# Patient Record
Sex: Female | Born: 1940 | Race: White | Hispanic: No | Smoking: Former smoker
Health system: Southern US, Community
[De-identification: ages and names within clinical notes are randomized; demographics above are authoritative.]

## PROBLEM LIST (undated history)

## (undated) ENCOUNTER — Encounter

## (undated) ENCOUNTER — Ambulatory Visit: Payer: MEDICARE

## (undated) ENCOUNTER — Ambulatory Visit

## (undated) ENCOUNTER — Telehealth

## (undated) DIAGNOSIS — E785 Hyperlipidemia, unspecified: Secondary | ICD-10-CM

## (undated) DIAGNOSIS — I639 Cerebral infarction, unspecified: Secondary | ICD-10-CM

## (undated) DIAGNOSIS — J449 Chronic obstructive pulmonary disease, unspecified: Secondary | ICD-10-CM

## (undated) DIAGNOSIS — C946 Myelodysplastic disease, not classified: Secondary | ICD-10-CM

## (undated) DIAGNOSIS — I10 Essential (primary) hypertension: Secondary | ICD-10-CM

## (undated) HISTORY — PX: TUBAL LIGATION: SHX77

## (undated) HISTORY — PX: SHOULDER SURGERY: SHX246

## (undated) HISTORY — DX: Cerebral infarction, unspecified: I63.9

---
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
## 2013-06-17 ENCOUNTER — Ambulatory Visit: Payer: Self-pay | Admitting: Family Medicine

## 2013-08-07 ENCOUNTER — Ambulatory Visit: Payer: Self-pay | Admitting: Internal Medicine

## 2013-08-07 LAB — CBC CANCER CENTER
Comment - H1-Com5: NORMAL
Eosinophil: 2 %
HCT: 28.9 % — ABNORMAL LOW (ref 35.0–47.0)
HGB: 9.7 g/dL — ABNORMAL LOW (ref 12.0–16.0)
Lymphocytes: 18 %
MCH: 28.8 pg (ref 26.0–34.0)
MCHC: 33.3 g/dL (ref 32.0–36.0)
MCV: 86 fL (ref 80–100)
MONOS PCT: 2 %
Platelet: 191 x10 3/mm (ref 150–440)
RBC: 3.36 10*6/uL — ABNORMAL LOW (ref 3.80–5.20)
RDW: 19.5 % — AB (ref 11.5–14.5)
Segmented Neutrophils: 78 %
WBC: 5.5 x10 3/mm (ref 3.6–11.0)

## 2013-08-07 LAB — IRON AND TIBC
IRON: 76 ug/dL (ref 50–170)
Iron Bind.Cap.(Total): 303 ug/dL (ref 250–450)
Iron Saturation: 25 %
Unbound Iron-Bind.Cap.: 227 ug/dL

## 2013-08-07 LAB — FOLATE: Folic Acid: 29 ng/mL (ref 3.1–100.0)

## 2013-08-07 LAB — RETICULOCYTES
ABSOLUTE RETIC COUNT: 0.1412 10*6/uL (ref 0.019–0.186)
Reticulocyte: 4.05 % — ABNORMAL HIGH (ref 0.4–3.1)

## 2013-08-07 LAB — SEDIMENTATION RATE: Erythrocyte Sed Rate: 16 mm/hr (ref 0–30)

## 2013-08-07 LAB — LACTATE DEHYDROGENASE: LDH: 213 U/L (ref 81–246)

## 2013-08-07 LAB — FERRITIN: FERRITIN (ARMC): 76 ng/mL (ref 8–388)

## 2013-08-08 LAB — URINE IEP, RANDOM

## 2013-08-11 LAB — OCCULT BLOOD X 1 CARD TO LAB, STOOL
Occult Blood, Feces: NEGATIVE
Occult Blood, Feces: NEGATIVE

## 2013-08-11 LAB — PROT IMMUNOELECTROPHORES(ARMC)

## 2013-08-19 LAB — CBC CANCER CENTER
Basophil: 2 %
EOS PCT: 4 %
HCT: 29.4 % — ABNORMAL LOW (ref 35.0–47.0)
HGB: 9.9 g/dL — ABNORMAL LOW (ref 12.0–16.0)
Lymphocytes: 14 %
MCH: 29.3 pg (ref 26.0–34.0)
MCHC: 33.7 g/dL (ref 32.0–36.0)
MCV: 87 fL (ref 80–100)
Monocytes: 11 %
PLATELETS: 204 x10 3/mm (ref 150–440)
RBC: 3.39 10*6/uL — ABNORMAL LOW (ref 3.80–5.20)
RDW: 19.3 % — AB (ref 11.5–14.5)
Segmented Neutrophils: 69 %
WBC: 6.6 x10 3/mm (ref 3.6–11.0)

## 2013-08-25 ENCOUNTER — Ambulatory Visit: Payer: Self-pay | Admitting: Internal Medicine

## 2013-09-24 ENCOUNTER — Ambulatory Visit: Payer: Self-pay | Admitting: Internal Medicine

## 2013-11-06 ENCOUNTER — Ambulatory Visit: Payer: Self-pay | Admitting: Internal Medicine

## 2013-11-06 LAB — CBC CANCER CENTER
BASOS ABS: 0.1 x10 3/mm (ref 0.0–0.1)
BASOS PCT: 0.8 %
EOS ABS: 0.2 x10 3/mm (ref 0.0–0.7)
Eosinophil %: 3.3 %
HCT: 31.7 % — ABNORMAL LOW (ref 35.0–47.0)
HGB: 10.7 g/dL — AB (ref 12.0–16.0)
LYMPHS ABS: 1.1 x10 3/mm (ref 1.0–3.6)
Lymphocyte %: 18 %
MCH: 30 pg (ref 26.0–34.0)
MCHC: 33.9 g/dL (ref 32.0–36.0)
MCV: 89 fL (ref 80–100)
Monocyte #: 0.4 x10 3/mm (ref 0.2–0.9)
Monocyte %: 6.9 %
Neutrophil #: 4.5 x10 3/mm (ref 1.4–6.5)
Neutrophil %: 71 %
PLATELETS: 184 x10 3/mm (ref 150–440)
RBC: 3.57 10*6/uL — ABNORMAL LOW (ref 3.80–5.20)
RDW: 16.5 % — ABNORMAL HIGH (ref 11.5–14.5)
WBC: 6.4 x10 3/mm (ref 3.6–11.0)

## 2013-11-25 ENCOUNTER — Ambulatory Visit: Payer: Self-pay | Admitting: Internal Medicine

## 2013-12-25 ENCOUNTER — Ambulatory Visit: Payer: Self-pay | Admitting: Internal Medicine

## 2013-12-25 LAB — CBC CANCER CENTER
Basophil #: 0 x10 3/mm (ref 0.0–0.1)
Basophil %: 0.6 %
Eosinophil #: 0.1 x10 3/mm (ref 0.0–0.7)
Eosinophil %: 2.6 %
HCT: 30.2 % — AB (ref 35.0–47.0)
HGB: 10.3 g/dL — AB (ref 12.0–16.0)
Lymphocyte #: 0.7 x10 3/mm — ABNORMAL LOW (ref 1.0–3.6)
Lymphocyte %: 13.5 %
MCH: 30.4 pg (ref 26.0–34.0)
MCHC: 34 g/dL (ref 32.0–36.0)
MCV: 89 fL (ref 80–100)
Monocyte #: 0.5 x10 3/mm (ref 0.2–0.9)
Monocyte %: 9 %
Neutrophil #: 3.8 x10 3/mm (ref 1.4–6.5)
Neutrophil %: 74.3 %
PLATELETS: 161 x10 3/mm (ref 150–440)
RBC: 3.38 10*6/uL — ABNORMAL LOW (ref 3.80–5.20)
RDW: 15.7 % — AB (ref 11.5–14.5)
WBC: 5.1 x10 3/mm (ref 3.6–11.0)

## 2014-01-25 ENCOUNTER — Ambulatory Visit: Payer: Self-pay | Admitting: Internal Medicine

## 2014-02-06 ENCOUNTER — Ambulatory Visit: Payer: Self-pay | Admitting: Nurse Practitioner

## 2014-02-10 ENCOUNTER — Ambulatory Visit: Payer: Self-pay | Admitting: Internal Medicine

## 2014-02-24 ENCOUNTER — Ambulatory Visit: Payer: Self-pay | Admitting: Internal Medicine

## 2014-02-26 LAB — CBC CANCER CENTER
Basophil #: 0 x10 3/mm (ref 0.0–0.1)
Basophil %: 0.7 %
EOS ABS: 0.2 x10 3/mm (ref 0.0–0.7)
EOS PCT: 2.4 %
HCT: 31.5 % — ABNORMAL LOW (ref 35.0–47.0)
HGB: 10.5 g/dL — ABNORMAL LOW (ref 12.0–16.0)
LYMPHS ABS: 0.9 x10 3/mm — AB (ref 1.0–3.6)
Lymphocyte %: 13.1 %
MCH: 29.5 pg (ref 26.0–34.0)
MCHC: 33.3 g/dL (ref 32.0–36.0)
MCV: 88 fL (ref 80–100)
MONO ABS: 0.4 x10 3/mm (ref 0.2–0.9)
MONOS PCT: 6.1 %
NEUTROS PCT: 77.7 %
Neutrophil #: 5.1 x10 3/mm (ref 1.4–6.5)
Platelet: 180 x10 3/mm (ref 150–440)
RBC: 3.56 10*6/uL — AB (ref 3.80–5.20)
RDW: 16.9 % — ABNORMAL HIGH (ref 11.5–14.5)
WBC: 6.6 x10 3/mm (ref 3.6–11.0)

## 2014-02-26 LAB — CREATININE, SERUM
Creatinine: 0.62 mg/dL (ref 0.60–1.30)
EGFR (African American): >60
EGFR (Non-African Amer.): >60

## 2014-02-26 LAB — LACTATE DEHYDROGENASE: LDH: 201 U/L (ref 81–246)

## 2014-03-27 ENCOUNTER — Ambulatory Visit: Payer: Self-pay | Admitting: Internal Medicine

## 2014-07-02 ENCOUNTER — Ambulatory Visit
Admit: 2014-07-02 | Disposition: A | Discharge status: Home / Self Care | Payer: Self-pay | Attending: Internal Medicine | Admitting: Internal Medicine

## 2014-07-02 LAB — CBC CANCER CENTER
BASOS PCT: 0.9 %
Basophil #: 0 x10 3/mm (ref 0.0–0.1)
Eosinophil #: 0.2 x10 3/mm (ref 0.0–0.7)
Eosinophil %: 3.8 %
HCT: 30.1 % — AB (ref 35.0–47.0)
HGB: 10.2 g/dL — AB (ref 12.0–16.0)
LYMPHS PCT: 15.7 %
Lymphocyte #: 0.9 x10 3/mm — ABNORMAL LOW (ref 1.0–3.6)
MCH: 29.1 pg (ref 26.0–34.0)
MCHC: 34 g/dL (ref 32.0–36.0)
MCV: 86 fL (ref 80–100)
MONOS PCT: 7 %
Monocyte #: 0.4 x10 3/mm (ref 0.2–0.9)
NEUTROS ABS: 4.2 x10 3/mm (ref 1.4–6.5)
Neutrophil %: 72.6 %
Platelet: 187 x10 3/mm (ref 150–440)
RBC: 3.51 10*6/uL — ABNORMAL LOW (ref 3.80–5.20)
RDW: 16.5 % — AB (ref 11.5–14.5)
WBC: 5.7 x10 3/mm (ref 3.6–11.0)

## 2014-07-06 LAB — PROT IMMUNOELECTROPHORES(ARMC)

## 2014-10-28 ENCOUNTER — Other Ambulatory Visit: Payer: Self-pay | Admitting: *Deleted

## 2014-10-28 DIAGNOSIS — D472 Monoclonal gammopathy: Secondary | ICD-10-CM

## 2014-10-29 ENCOUNTER — Inpatient Hospital Stay: Payer: Medicare Other | Attending: Internal Medicine

## 2014-10-29 DIAGNOSIS — R161 Splenomegaly, not elsewhere classified: Secondary | ICD-10-CM | POA: Diagnosis not present

## 2014-10-29 DIAGNOSIS — D649 Anemia, unspecified: Secondary | ICD-10-CM | POA: Insufficient documentation

## 2014-10-29 DIAGNOSIS — D472 Monoclonal gammopathy: Secondary | ICD-10-CM | POA: Insufficient documentation

## 2014-10-29 LAB — CBC WITH DIFFERENTIAL/PLATELET
BASOS PCT: 1 %
Basophils Absolute: 0 10*3/uL (ref 0–0.1)
Eosinophils Absolute: 0.2 10*3/uL (ref 0–0.7)
Eosinophils Relative: 3 %
HEMATOCRIT: 28.8 % — AB (ref 35.0–47.0)
HEMOGLOBIN: 10 g/dL — AB (ref 12.0–16.0)
LYMPHS ABS: 0.8 10*3/uL — AB (ref 1.0–3.6)
Lymphocytes Relative: 17 %
MCH: 30.6 pg (ref 26.0–34.0)
MCHC: 34.8 g/dL (ref 32.0–36.0)
MCV: 87.9 fL (ref 80.0–100.0)
MONO ABS: 0.3 10*3/uL (ref 0.2–0.9)
MONOS PCT: 7 %
NEUTROS PCT: 72 %
Neutro Abs: 3.5 10*3/uL (ref 1.4–6.5)
PLATELETS: 164 10*3/uL (ref 150–440)
RBC: 3.27 MIL/uL — AB (ref 3.80–5.20)
RDW: 15.3 % — AB (ref 11.5–14.5)
WBC: 4.8 10*3/uL (ref 3.6–11.0)

## 2014-10-29 LAB — CREATININE, SERUM
CREATININE: 0.56 mg/dL (ref 0.44–1.00)
GFR calc Af Amer: >60 mL/min (ref >60)
GFR calc non Af Amer: >60 mL/min (ref >60)

## 2014-10-29 LAB — LACTATE DEHYDROGENASE: LDH: 157 U/L (ref 98–192)

## 2014-10-29 LAB — CALCIUM: Calcium: 9.4 mg/dL (ref 8.9–10.3)

## 2014-10-30 LAB — PROTEIN ELECTROPHORESIS, SERUM
A/G Ratio: 1.5 (ref 0.7–1.7)
ALPHA-1-GLOBULIN: 0.3 g/dL (ref 0.0–0.4)
ALPHA-2-GLOBULIN: 0.5 g/dL (ref 0.4–1.0)
Albumin ELP: 3.9 g/dL (ref 2.9–4.4)
Beta Globulin: 0.8 g/dL (ref 0.7–1.3)
Gamma Globulin: 1 g/dL (ref 0.4–1.8)
Globulin, Total: 2.6 g/dL (ref 2.2–3.9)
Total Protein ELP: 6.5 g/dL (ref 6.0–8.5)

## 2014-11-09 ENCOUNTER — Other Ambulatory Visit: Payer: Self-pay | Admitting: Internal Medicine

## 2014-11-09 DIAGNOSIS — R270 Ataxia, unspecified: Secondary | ICD-10-CM

## 2014-11-09 DIAGNOSIS — R29898 Other symptoms and signs involving the musculoskeletal system: Secondary | ICD-10-CM

## 2014-11-10 ENCOUNTER — Other Ambulatory Visit: Payer: Self-pay

## 2014-11-10 ENCOUNTER — Emergency Department: Payer: Medicare Other

## 2014-11-10 ENCOUNTER — Emergency Department
Admission: EM | Admit: 2014-11-10 | Discharge: 2014-11-10 | Disposition: A | Discharge status: Other Acute Inpatient Hospital | Payer: Medicare Other | Attending: Emergency Medicine | Admitting: Emergency Medicine

## 2014-11-10 ENCOUNTER — Encounter: Payer: Self-pay | Admitting: Emergency Medicine

## 2014-11-10 DIAGNOSIS — W1839XA Other fall on same level, initial encounter: Secondary | ICD-10-CM | POA: Diagnosis not present

## 2014-11-10 DIAGNOSIS — D432 Neoplasm of uncertain behavior of brain, unspecified: Secondary | ICD-10-CM | POA: Diagnosis not present

## 2014-11-10 DIAGNOSIS — S99921A Unspecified injury of right foot, initial encounter: Secondary | ICD-10-CM | POA: Insufficient documentation

## 2014-11-10 DIAGNOSIS — Y9389 Activity, other specified: Secondary | ICD-10-CM | POA: Diagnosis not present

## 2014-11-10 DIAGNOSIS — G936 Cerebral edema: Secondary | ICD-10-CM

## 2014-11-10 DIAGNOSIS — Y998 Other external cause status: Secondary | ICD-10-CM | POA: Diagnosis not present

## 2014-11-10 DIAGNOSIS — D496 Neoplasm of unspecified behavior of brain: Secondary | ICD-10-CM

## 2014-11-10 DIAGNOSIS — I10 Essential (primary) hypertension: Secondary | ICD-10-CM | POA: Insufficient documentation

## 2014-11-10 DIAGNOSIS — Z720 Tobacco use: Secondary | ICD-10-CM | POA: Diagnosis not present

## 2014-11-10 DIAGNOSIS — R531 Weakness: Secondary | ICD-10-CM | POA: Diagnosis present

## 2014-11-10 DIAGNOSIS — Y9289 Other specified places as the place of occurrence of the external cause: Secondary | ICD-10-CM | POA: Insufficient documentation

## 2014-11-10 HISTORY — DX: Hyperlipidemia, unspecified: E78.5

## 2014-11-10 HISTORY — DX: Myelodysplastic disease, not elsewhere classified: C94.6

## 2014-11-10 HISTORY — DX: Essential (primary) hypertension: I10

## 2014-11-10 LAB — CBC
HEMATOCRIT: 30.8 % — AB (ref 35.0–47.0)
HEMOGLOBIN: 10.6 g/dL — AB (ref 12.0–16.0)
MCH: 31 pg (ref 26.0–34.0)
MCHC: 34.5 g/dL (ref 32.0–36.0)
MCV: 89.9 fL (ref 80.0–100.0)
Platelets: 171 10*3/uL (ref 150–440)
RBC: 3.43 MIL/uL — AB (ref 3.80–5.20)
RDW: 15.5 % — ABNORMAL HIGH (ref 11.5–14.5)
WBC: 6 10*3/uL (ref 3.6–11.0)

## 2014-11-10 LAB — DIFFERENTIAL
BASOS ABS: 0 10*3/uL (ref 0–0.1)
Basophils Relative: 1 %
EOS ABS: 0.1 10*3/uL (ref 0–0.7)
Eosinophils Relative: 2 %
LYMPHS ABS: 0.7 10*3/uL — AB (ref 1.0–3.6)
LYMPHS PCT: 12 %
MONOS PCT: 5 %
Monocytes Absolute: 0.3 10*3/uL (ref 0.2–0.9)
NEUTROS PCT: 80 %
Neutro Abs: 4.8 10*3/uL (ref 1.4–6.5)

## 2014-11-10 LAB — COMPREHENSIVE METABOLIC PANEL
ALBUMIN: 4.6 g/dL (ref 3.5–5.0)
ALK PHOS: 34 U/L — AB (ref 38–126)
ALT: 20 U/L (ref 14–54)
AST: 29 U/L (ref 15–41)
Anion gap: 8 (ref 5–15)
BILIRUBIN TOTAL: 2.6 mg/dL — AB (ref 0.3–1.2)
BUN: 8 mg/dL (ref 6–20)
CALCIUM: 9.2 mg/dL (ref 8.9–10.3)
CO2: 27 mmol/L (ref 22–32)
CREATININE: 0.5 mg/dL (ref 0.44–1.00)
Chloride: 106 mmol/L (ref 101–111)
GFR calc Af Amer: >60 mL/min (ref >60)
GLUCOSE: 115 mg/dL — AB (ref 65–99)
POTASSIUM: 3.3 mmol/L — AB (ref 3.5–5.1)
Sodium: 141 mmol/L (ref 135–145)
TOTAL PROTEIN: 7.1 g/dL (ref 6.5–8.1)

## 2014-11-10 LAB — PROTIME-INR
INR: 1.09
Prothrombin Time: 14.3 seconds (ref 11.4–15.0)

## 2014-11-10 LAB — APTT: APTT: 31 s (ref 24–36)

## 2014-11-10 LAB — TROPONIN I

## 2014-11-10 MED ORDER — DEXAMETHASONE SODIUM PHOSPHATE 10 MG/ML IJ SOLN
10.0000 mg | Freq: Once | INTRAMUSCULAR | Status: AC
  Administered 2014-11-10: 10 mg via INTRAVENOUS
  Filled 2014-11-10: qty 1

## 2014-11-10 NOTE — ED Notes (Signed)
Awaiting ems to arrive for transportation of pt to [facility].

## 2014-11-10 NOTE — ED Provider Notes (Signed)
[facility] Emergency Department Provider Note  ____________________________________________  Time seen: Approximately 12:27 PM  I have reviewed the triage vital signs and the nursing notes.   HISTORY  Chief Complaint Weakness    HPI Sarah Wyatt is a 74 y.o. female with a history that includes myelodysplastic disease but no specific cancer/tumor historywho presents with a dull mild persistent headache for approximately 1 week and difficulty walking for the last 3 days.  She states headaches are very unusual for her but she did not worry about it until she began to feel weak primarily in her lower extremities and having issues with her balance.  She states that the weakness feels worse on the right than on the left.  She had a fall on Sunday which required EMS to help her up.  She saw her primary care doctor yesterday and has a CT scheduled, but her symptoms were worse today because she was completely unable to stand without assistance.  She called EMS for transport to this facility  Given her neurological symptoms, she was identified as a code stroke in triage and sent immediately for a CT scan of her head.  I saw her in the exam room following the completion of the CT scan.  She denies any recent fever/chills, chest pain, shortness of breath, abdominal pain, dysuria.  She has no history of diabetes or hypoglycemia.  She injured the third toe of her right foot during her fall but sustained no other injuries of which she is aware.   Past Medical History  Diagnosis Date  . Hypertension   . Hyperlipidemia   . Myelodysplastic disease     There are no active problems to display for this patient.   Past Surgical History  Procedure Laterality Date  . Tubal ligation    . Shoulder surgery Left     No current outpatient prescriptions on file.  Allergies Sulfa antibiotics  History reviewed. No pertinent family history.  Social History Social History   Substance Use Topics  . Smoking status: Current Every Day Smoker  . Smokeless tobacco: None  . Alcohol Use: No    Review of Systems Constitutional: No fever/chills Eyes: No visual changes. ENT: No sore throat. Cardiovascular: Denies chest pain. Respiratory: Denies shortness of breath. Gastrointestinal: No abdominal pain.  No nausea, no vomiting.  No diarrhea.  No constipation. Genitourinary: Negative for dysuria. Musculoskeletal: Negative for back pain. Skin: Negative for rash. Neurological: Dull headache for 1 week.  Rapidly worsening extremity weakness, worse on R than L. Inability to stand/walk Psychiatric:No abnormalities 10-point ROS otherwise negative.  ____________________________________________   PHYSICAL EXAM:  VITAL SIGNS: ED Triage Vitals  Enc Vitals Group     BP 11/10/14 1206 125/69 mmHg     Pulse Rate 11/10/14 1206 18     Resp 11/10/14 1206 20     Temp 11/10/14 1206 98.4 F (36.9 C)     Temp Source 11/10/14 1206 Oral     SpO2 11/10/14 1206 100 %     Weight 11/10/14 1206 160 lb (72.576 kg)     Height 11/10/14 1206 5\' 4"  (1.626 m)     Head Cir --      Peak Flow --      Pain Score 11/10/14 1206 0     Pain Loc --      Pain Edu? --      Excl. in [place]? --     Constitutional: Alert and oriented. Well appearing and in no acute  distress. Eyes: Conjunctivae are normal. PERRL. EOMI. Denies visual disturbances Head: Atraumatic. Nose: No congestion/rhinnorhea. Mouth/Throat: Mucous membranes are moist.  Oropharynx non-erythematous. Protecting airway. Neck: No stridor.   Cardiovascular: Normal rate, regular rhythm. Grossly normal heart sounds.  Good peripheral circulation. Respiratory: Normal respiratory effort.  No retractions. Lungs CTAB. Gastrointestinal: Soft and nontender. No distention. No abdominal bruits. No CVA tenderness. Musculoskeletal: No lower extremity tenderness nor edema.  No joint effusions.  Ecchymosis and slight swelling to right 3rd toe s/p  fall 3 days ago, no significant tenderness. Neurologic:  Occasional word finding difficulties which seems to surprise the patient.  AOx3.  Profound dysmetria on finger-to-nose with dominant (R) hand. R arm pronator drift.  Barely able to lift R leg against gravity.  Decreased strength in L leg.  Grip strength normal bilaterally.  Normal bilateral plantar and dorsiflexion.   Skin:  Skin is warm, dry and intact. No rash noted. Psychiatric: Mood and affect are normal. Speech and behavior are normal.  ____________________________________________   LABS (all labs ordered are listed, but only abnormal results are displayed)  Labs Reviewed  PROTIME-INR  APTT  CBC  DIFFERENTIAL  COMPREHENSIVE METABOLIC PANEL  TROPONIN I  CBG MONITORING, ED   ____________________________________________  EKG  ED ECG REPORT I, Nautia Lem, the attending physician, personally viewed and interpreted this ECG.  Date: 11/10/2014 EKG Time: 12:48 Rate: 75 Rhythm: normal sinus rhythm QRS Axis: normal Intervals: normal ST/T Wave abnormalities: normal Conduction Disutrbances: none Narrative Interpretation: unremarkable Artifact is present in the rhythm strip of lead V1 but is not clinically significant ____________________________________________  RADIOLOGY I, Matilde Pottenger, personally viewed and evaluated these images (Head CT) as part of my medical decision making.  The radiologist called a report to my colleague, Dr. Delman Kitten, who relayed the findings to me in person.   Ct Head Wo Contrast  11/10/2014   CLINICAL DATA:  Code stroke. Right leg weakness and difficulty walking since Sunday  EXAM: CT HEAD WITHOUT CONTRAST  TECHNIQUE: Contiguous axial images were obtained from the base of the skull through the vertex without intravenous contrast.  COMPARISON:  None.  FINDINGS: 2.8 cm mass noted within the left parietal lobe with surrounding edema. No significant mass-effect or midline shift. The mass possibly  extends across the midline along the posterior corpus callosum. No hydrocephalus. No acute infarct or hemorrhage.  Mucosal thickening in the paranasal sinuses. Mastoid air cells are clear.  IMPRESSION: 2.8 cm left parietal mass which possibly extends across the posterior corpus callosum. Surrounding vasogenic edema. Differential considerations would include metastasis, primary CNS tumor or lymphoma. Consider further evaluation with MRI.  Critical Value/emergent results were called by telephone at the time of interpretation on 11/10/2014 at 12:29 pm to Dr. Delman Kitten , who verbally acknowledged these results.   Electronically Signed   By: Rolm Baptise M.D.   On: 11/10/2014 12:32    ____________________________________________   PROCEDURES  Procedure(s) performed: None  Critical Care performed: Yes, see critical care note(s)  CRITICAL CARE Performed by: Hinda Kehr   Total critical care time: 30 minutes  Critical care time was exclusive of separately billable procedures and treating other patients.  Critical care was necessary to treat or prevent imminent or life-threatening deterioration.  Critical care was time spent personally by me on the following activities: development of treatment plan with patient and/or surrogate as well as nursing, discussions with consultants, evaluation of patient's response to treatment, examination of patient, obtaining history from patient or surrogate, ordering and  performing treatments and interventions, ordering and review of laboratory studies, ordering and review of radiographic studies, pulse oximetry and re-evaluation of patient's condition.   NIH Stroke Scale  Interval: Baseline Time: 12:54 PM Person Administering Scale: Vang Kraeger  Administer stroke scale items in the order listed. Record performance in each category after each subscale exam. Do not go back and change scores. Follow directions provided for each exam technique. Scores should  reflect what the patient does, not what the clinician thinks the patient can do. The clinician should record answers while administering the exam and work quickly. Except where indicated, the patient should not be coached (i.e., repeated requests to patient to make a special effort).   1a  Level of consciousness: 0=alert; keenly responsive  1b. LOC questions:  0=Performs both tasks correctly  1c. LOC commands: 0=Performs both tasks correctly  2.  Best Gaze: 0=normal  3.  Visual: 0=No visual loss  4. Facial Palsy: 0=Normal symmetric movement  5a.  Motor left arm: 0=No drift, limb holds 90 (or 45) degrees for full 10 seconds  5b.  Motor right arm: 1=Drift, limb holds 90 (or 45) degrees but drifts down before full 10 seconds: does not hit bed  6a. motor left leg: 1=Drift, limb holds 90 (or 45) degrees but drifts down before full 10 seconds: does not hit bed  6b  Motor right leg:  2=Some effort against gravity, limb cannot get to or maintain (if cured) 90 (or 45) degrees, drifts down to bed, but has some effort against gravity  7. Limb Ataxia: 0=Absent  8.  Sensory: 0=Normal; no sensory loss  9. Best Language:  1=Mild to moderate aphasia; some obvious loss of fluency or facility of comprehension without significant limitation on ideas expressed or form of expression.  10. Dysarthria: 0=Normal  11. Extinction and Inattention: 0=No abnormality  12. Distal motor function: 0=Normal   Total:   5     ____________________________________________   INITIAL IMPRESSION / ASSESSMENT AND PLAN / ED COURSE  Pertinent labs & imaging results that were available during my care of the patient were reviewed by me and considered in my medical decision making (see chart for details).  Profound neurological deficits including occasional mild aphasia, significant cerebellar deficits with severe dysmetria, severe weakness currently on the right side, inability to stand/walk.  The patient has a tumor in the left  parietal lobe, approximately 2.8 cm, which may extend across the posterior corpus callosum.  She has surrounding vasogenic edema with no significant mass effect or midline shift.  She is not having airway concern at this time.  I am giving Decadron 10 mg IV, sending basic labs, and establishing peripheral IV access.  I spoke with the patient and we discussed the options of her going to any distracting hospitals for further treatment and we were both comfortable with her going to [facility] in [place].  I have spoken with the transfer center and with Dr. Pete Pelt at [facility] emergency Department who accepted the patient as an ED to ED transfer.    Will transport by [facility] EMS if possible.  Updated patient.  ____________________________________________  FINAL CLINICAL IMPRESSION(S) / ED DIAGNOSES  Final diagnoses:  Brain tumor  Vasogenic cerebral edema      NEW MEDICATIONS STARTED DURING THIS VISIT:  New Prescriptions   No medications on file     Hinda Kehr, MD 11/10/14 1310

## 2014-11-10 NOTE — ED Notes (Addendum)
Pt to ed with c/o weakness and difficulty walking since Sunday.  Pt was seen at [facility] yesterday and has CT scheduled and "other tests"  Pt states she was unable to stand this am and was concerned so she came for eval in ED.  Pt alert and oriented and skin warm and dry.

## 2014-11-17 ENCOUNTER — Ambulatory Visit: Admission: RE | Admit: 2014-11-17 | Payer: Medicare Other | Source: Ambulatory Visit

## 2014-12-16 ENCOUNTER — Ambulatory Visit: Payer: Medicare Other | Admitting: Physical Therapy

## 2014-12-16 ENCOUNTER — Encounter: Payer: Medicare Other | Admitting: Occupational Therapy

## 2014-12-25 ENCOUNTER — Encounter: Payer: Self-pay | Admitting: Occupational Therapy

## 2014-12-25 ENCOUNTER — Ambulatory Visit: Payer: Medicare Other | Attending: Nurse Practitioner | Admitting: Occupational Therapy

## 2014-12-25 DIAGNOSIS — I639 Cerebral infarction, unspecified: Secondary | ICD-10-CM

## 2014-12-25 DIAGNOSIS — R531 Weakness: Secondary | ICD-10-CM | POA: Insufficient documentation

## 2014-12-25 DIAGNOSIS — C8590 Non-Hodgkin lymphoma, unspecified, unspecified site: Secondary | ICD-10-CM | POA: Diagnosis present

## 2014-12-25 NOTE — Therapy (Signed)
[place] MAIN [facility] SERVICES [address] [place], [place], [address] Phone: [phone]   Fax:  [phone]  Occupational Therapy Evaluation  Patient Details  Name: Sarah Wyatt MRN: [id number] Date of Birth: September 06, 1940 Referring Lateisha Thurlow:  Sallee Lange, *  Encounter Date: 12/25/2014      OT End of Session - 12/25/14 1534    Visit Number 1   Number of Visits 1   OT Start Time 1100   OT Stop Time 1152   OT Time Calculation (min) 52 min   Behavior During Therapy [facility] for tasks assessed/performed      Past Medical History  Diagnosis Date  . Hypertension   . Hyperlipidemia   . Myelodysplastic disease   . Stroke     Past Surgical History  Procedure Laterality Date  . Tubal ligation    . Shoulder surgery Left     There were no vitals filed for this visit.  Visit Diagnosis:  CVA (cerebral vascular accident) - Plan: Ot plan of care cert/re-cert  Lymphoma - Plan: Ot plan of care cert/re-cert      Subjective Assessment - 12/25/14 1109    Subjective  Patient reports she started to have some brain fog at times, was at home and started to feel weak.  Went to sit on the couch and missed the couch and was wedged between couch and had to call EMS.  Declined to go to the hospital.  A couple days later she went to the hospital after she was experiencing trouble speaking, brain fog.  Initally came to [facility] and was transferred to [facility].    Limitations no longer driving, taking public transportation.   Patient Stated Goals Patient reports she would like "to be my usual independent self."  Would like to drive again if possible.     Currently in Pain? No/denies   Multiple Pain Sites No           [facility] OT Assessment - 12/25/14 1116    Assessment   Diagnosis CVA/lymphoma of the brain   Onset Date 11/03/14   Prior Therapy rehab at [place]   Has the patient fallen in the past 6 months Yes   How many times? 1   Has  the patient had a decrease in activity level because of a fear of falling?  No   Is the patient reluctant to leave their home because of a fear of falling?  No   Home  Environment   Family/patient expects to be discharged to: Private residence   Living Arrangements Other relatives   Available Help at Discharge Family   Type of [place] entrance   Home Layout Multi-level   Bathroom Shower/Tub [place] only   Bathroom Toilet Handicapped height   Bathroom Accessibility Yes   [place] - 4 wheels;Cane -quad;Bedside commode   Lives With Family   Prior Function   Level of Independence Independent   Vocation Retired   ADL   Eating/Feeding Independent   Grooming Independent   Cabin crew independent   Lower Body Bathing Modified independent   Upper Body Dressing Independent   Lower Body Dressing Modified independent   Toilet Tranfer Modified independent   Toileting - Clothing Manipulation Modified independent   [place] Transfer Moderate assistance   IADL   Prior Level of Function Shopping independent   Shopping Assistance for  transportation   Prior Level of Function Light Housekeeping Independent   Light Housekeeping Performs light daily tasks such as dishwashing, bed making   Prior Level of Function Meal Prep Independent   Meal Prep Able to complete simple cold meal and snack prep   Prior Level of Function Community Mobility Independent    Medication Management Is responsible for taking medication in correct dosages at correct time   Prior Level of Function Financial Management independent   Mobility   Mobility Status Comments rollator    Written Expression   Dominant Hand Right   Vision - History   Visual History Macular degeneration   Cognition   Overall Cognitive Status Within Functional Limits for tasks assessed   Sensation   Light Touch Appears Intact   Stereognosis Appears  Intact   Hot/Cold Appears Intact   Proprioception Appears Intact   Coordination   Gross Motor Movements are Fluid and Coordinated Yes   Fine Motor Movements are Fluid and Coordinated Yes   9 Hole Peg Test Right;Left   Right 9 Hole Peg Test 24   Left 9 Hole Peg Test 30   ROM / Strength   AROM / PROM / Strength AROM;Strength   AROM   Overall AROM  Within functional limits for tasks performed   Overall AROM Comments Patient is limited to around 120 degrees of active shoulder flexion bilaterally which appears to be due to old injuries, Left shoulder surgery for frozen shoulder and R partial rotator cuff years ago.   Strength   Overall Strength Within functional limits for tasks performed   Overall Strength Comments Strength 3+/5 overall for bilateral UEs.    Hand Function   Right Hand Grip (lbs) 40   Right Hand Lateral Pinch 10 lbs   Right Hand 3 Point Pinch 15 lbs   Left Hand Grip (lbs) 32   Left Hand Lateral Pinch 11 lbs   Left 3 point pinch 14 lbs                         OT Education - 12/25/14 1533    Education provided Yes   Education Details Role of OT, need for PT evaluation to further assess gait.   Person(s) Educated Patient   Methods Explanation   Comprehension Verbalized understanding                    Plan - 12/25/14 1534    Clinical Impression Statement Patient is a 74 year old female who was admitted to [place] in August for a CVA and lymphoma of the brain, she then went to inpatient rehab and returned home around 12-08-14.  She reports she has made extensive gains especially in the last couple of weeks at home.  She was assessed by OT and is independent with all basic self care tasks, able to perform light homemaking skills and reports she feels near her baseline except for her gait and the inabliity to return to driving.  She does not require skilled OT intervention at this time.  She does have some mild weakness bilaterally in her  UEs which may be due to past injuries and has a set HEP she follows.  Recommended she follow up with PT regarding her gait since she is now walking with a rollator and was previously independent.  She does report some mild memory issues but has been able to implement compensatory strategies and has made significant improvements in this  area and demonstrated within functional limits for tasks in evaluation.   Plan No further OT intervention required at this time.  Recommend she follow up with PT eval regarding gait.   Consulted and Agree with Plan of Care Patient          G-Codes - 08-Jan-2015 1541    Functional Assessment Tool Used clinical judgment, strength testing, ADL assessment   Functional Limitation Self care   Self Care Current Status (V4008) At least 1 percent but less than 20 percent impaired, limited or restricted   Self Care Discharge Status [phone]) At least 1 percent but less than 20 percent impaired, limited or restricted      Problem List There are no active problems to display for this patient.  Achilles Dunk, OTR/L, CLT Lovett,Amy 01/08/2015, 3:46 PM  [place] MAIN [facility] SERVICES [address] [place], [place], [address] Phone: [phone]   Fax:  [phone]

## 2014-12-30 ENCOUNTER — Ambulatory Visit: Payer: Medicare Other | Attending: Nurse Practitioner

## 2014-12-30 DIAGNOSIS — R531 Weakness: Secondary | ICD-10-CM

## 2014-12-30 DIAGNOSIS — R2681 Unsteadiness on feet: Secondary | ICD-10-CM | POA: Insufficient documentation

## 2014-12-31 NOTE — Therapy (Signed)
Sarah Wyatt [facility] SERVICES [address] [place], [place], [address] Phone: [phone]   Fax:  [phone]  Physical Therapy Evaluation  Patient Details  Name: Sarah Wyatt MRN: [id number] Date of Birth: 09-19-1940 Referring Provider:  Sallee Lange, *  Encounter Date: 12/30/2014      PT End of Session - 12/31/14 1516    Visit Number 1   Number of Visits 9   Date for PT Re-Evaluation 01/27/15   Authorization Type 1/10 g codes   PT Start Time 07-10-1103   PT Stop Time 1200   PT Time Calculation (min) 55 min   Equipment Utilized During Treatment Gait belt   Activity Tolerance Patient tolerated treatment well   Behavior During Therapy [facility] for tasks assessed/performed      Past Medical History  Diagnosis Date  . Hypertension   . Hyperlipidemia   . Myelodysplastic disease ([place])   . Stroke [facility])     Past Surgical History  Procedure Laterality Date  . Tubal ligation    . Shoulder surgery Left     There were no vitals filed for this visit.  Visit Diagnosis:  Weakness generalized - Plan: PT plan of care cert/re-cert  Unsteadiness on feet - Plan: PT plan of care cert/re-cert      Subjective Assessment - 12/31/14 1509    Subjective Pt is present for a follow-up evaluation after being discharged from inpatient rehab 2 weeks ago.  Pt has noticed significant improvements since discharge especially in regards to her memory.  Patient was at home in August when she started to feel weak and went to sit on the couch and missed the couch and was wedged between couch and had to call EMS.  Pt declined to go to the hospital and few days later went to the hospital after she was experiencing trouble speaking, brain fog.  She initially came to [facility] and was transferred to [facility]  She was diagnosed with CVA and with lymphoma (brain tumor).  She received Inpatient rehab for 4 weeks. Pt's final radiation treatment is this Friday (12/31/14).  Prior to  incident, she was independent with all activities and now has to use a rolling walker.     Patient Stated Goals would like to regain as much as strength as she can   Currently in Pain? No/denies            [facility] PT Assessment - 12/31/14 1509    Precautions   Precautions Fall   Restrictions   Weight Bearing Restrictions No   Balance Screen   Has the patient fallen in the past 6 months Yes   How many times? 1   Has the patient had a decrease in activity level because of a fear of falling?  No   Is the patient reluctant to leave their home because of a fear of falling?  Yes   Prior Function   Level of Independence Independent   Vocation Retired   Chief of Staff Appears Intact   Hot/Cold --   Proprioception Appears Intact   AROM   Overall AROM  Within functional limits for tasks performed   Overall AROM Comments --   Strength   Overall Strength --   Overall Strength Comments --      PAIN: 0/10  POSTURE: Forward head posture, rounded shoulders in sitting and increased kyphosis   AROM: LE WFL  STRENGTH:  Graded on a 0-5  scale Muscle Group Left Right  Hip Flex 4- 4-  Hip Abd 3+ 3+  Hip IR/ER    Knee Flex 4+ 4-  Knee Ext 5 4+  Ankle DF 4 4  Great toe 3 3   SENSATION: LE light touch intact LE Dermatomes: WNL LE Myotomes: L5 weak  Reflex: Knee jerk: 2 bilaterally Ankle jerk: 1 bilaterally Ankle clonus: negative  SPECIAL TESTS: Finger to nose: WNL bilaterally rapid, alternating movements UE: WNL Heel to shin: WNL bilaterally   FUNCTIONAL MOBILITY: Pt requires UE assist to perform transfers  BALANCE: Pt demonstrates decreased static and dynamic balance and seeks rollator or nearby object for balance  GAIT:  Pt ambulates with reciprical gait pattern with 4 wheeled walker, equal stride length and bilateral foot clearance When pt ambulates without an assistive device, she ambulates with a shorter stride length,  decreased single leg stance, decreasds hip flexion, decreases her speed and is unsteady   OUTCOME MEASURES: TEST Outcome Interpretation  5 times sit<>stand 27.41sec With UE assist  >54 yo, >15 sec indicates increased risk for falls  10 meter walk test 0.7m/s with rollator 0.48 m/s without rollator  <1.0 m/s indicates increased risk for falls; limited community ambulator  Timed up and Go          20.26  Sec with rollator 32.46 without rollator <14 sec indicates increased risk for falls      Berg Balance Assessment 32 <36/56 (100% risk for falls), 37-45 (80% risk for falls); 46-51 (>50% risk for falls); 52-55 (lower risk <25% of falls)  9 Hole Peg Test L:                R:                            PT Education - 12/31/14 1516    Education provided Yes   Education Details plan of care, outcome measure values   Person(s) Educated Patient   Methods Explanation   Comprehension Verbalized understanding             PT Long Term Goals - 12/31/14 1527    PT LONG TERM GOAL #1   Title pt's 5x sit to stand will improve by 8 seconds indicating improved functional LE strength for improved transfer ability    Baseline in 27.1 seonds with UE assist on eval (10/5)   Time 4   Period Weeks   Status New   PT LONG TERM GOAL #2   Title pt will ambulate with a gaist speed at least 0.8 m/s with LRAD to ambulate safely in the community   Baseline 0.62 m/s with rollator on 10/5   Time 4   Period Weeks   Status New   PT LONG TERM GOAL #3   Title pt's Berg balance score will improve by at least 8 points indicating decreased risk of falls    Baseline 32/56 on 10/5   Time 4   Period Weeks   Status New               Plan - 12/31/14 1520    Clinical Impression Statement Patient is a pleasant 74 year old female who was admitted to [place] in August for a CVA and lymphoma of the brain.  Based on her history and exam, she presents with residual LE functional  weakness, decreased balance (static and dynamic) and is at risk of falls.  She is aware of  her deficits and risk for falls.   Pt would benefit from skilled PT services to address her impairments to improve her functional mobility, decrease risk of falls, return to prior level of function and improve quality of life.   Pt will benefit from skilled therapeutic intervention in order to improve on the following deficits Postural dysfunction;Decreased strength;Decreased balance;Decreased activity tolerance;Abnormal gait   Rehab Potential Fair   Clinical Impairments Affecting Rehab Potential terminal brain lymphoma    PT Frequency 2x / week   PT Duration 4 weeks   PT Treatment/Interventions Balance training;Neuromuscular re-education;Gait training;Therapeutic exercise;Manual techniques;Therapeutic activities;Energy conservation          G-Codes - 01/06/2015 1534    Functional Assessment Tool Used history, clinical judgment, outcome measures    Functional Limitation Mobility: Walking and moving around   Mobility: Walking and Moving Around Current Status [phone]) At least 20 percent but less than 40 percent impaired, limited or restricted   Mobility: Walking and Moving Around Goal Status [phone]) At least 1 percent but less than 20 percent impaired, limited or restricted       Problem List There are no active problems to display for this patient.  Renford Dills, SPT This entire session was performed under direct supervision and direction of a licensed Chiropractor . I have personally read, edited and approve of the note as written. Gorden Harms. Tortorici, PT, DPT [phone]  Tortorici,Sarah 01-06-15, 4:39 PM  [place] [facility] SERVICES [address] [place], [place], [address] Phone: [phone]   Fax:  [phone]

## 2015-01-05 ENCOUNTER — Ambulatory Visit: Payer: Medicare Other

## 2015-01-05 DIAGNOSIS — R531 Weakness: Secondary | ICD-10-CM | POA: Diagnosis not present

## 2015-01-05 DIAGNOSIS — R2681 Unsteadiness on feet: Secondary | ICD-10-CM

## 2015-01-06 NOTE — Patient Instructions (Signed)
Standing hip extension and abduction 2x10 each LE  Heel raises 2x10 One foot on six inch step 2x10 with 5 sec hold each LE Toe taps on 6 inch step 2x10 each LE

## 2015-01-06 NOTE — Therapy (Signed)
[place] MAIN [facility] SERVICES [address] [place], [place], [address] Phone: [phone]   Fax:  [phone]  Physical Therapy Treatment  Patient Details  Name: Sarah Wyatt MRN: [id number] Date of Birth: 1940/08/09 Referring Provider:  Sallee Lange, *  Encounter Date: 01/05/2015      PT End of Session - 01/06/15 0917    Visit Number 2   Number of Visits 9   Date for PT Re-Evaluation 01/27/15   Authorization Type 2/10 g codes   PT Start Time 19-Jul-2128   PT Stop Time Jul 19, 2213   PT Time Calculation (min) 45 min   Equipment Utilized During Treatment Gait belt   Activity Tolerance Patient tolerated treatment well   Behavior During Therapy [facility] for tasks assessed/performed      Past Medical History  Diagnosis Date  . Hypertension   . Hyperlipidemia   . Myelodysplastic disease ([place])   . Stroke [facility])     Past Surgical History  Procedure Laterality Date  . Tubal ligation    . Shoulder surgery Left     There were no vitals filed for this visit.  Visit Diagnosis:  Weakness generalized  Unsteadiness on feet      Subjective Assessment - 01/06/15 0915    Subjective Pt relates she is doing well and feels like she is progressing well and feels like her "stamina" is improving.  pt was able to go upstairs yesterday for the first time and was fatigued afterwards and coming down was easier.  Starting today, pt is going to be alone during the day and her family will be with her in the evening   Patient Stated Goals would like to regain as much as strength as she can   Currently in Pain? No/denies      There ex: Nustep level 2-4 x 4 min: no charge Standing hip extension and abduction 2x10 each LE in //bars Heel raises in //bars 2x10 Marching in sitting x10 each LE (active recovery) Long arch quads in sitting x10 each LE(active recovery) Pt educated on long roll for supine to sit bed mobility for improved energy conservation x2 Pt  required verbal, visual and tactile cues for correct exercise technique   Neuro re-ed: One foot on six inch step 2x10 with 5 sec hold each LE, pt requires occasional UE assist when R foot in on stool Toe taps on 6 inch step 2x10 each LE Pt becomes unsteady during L SLS/placing more weigh ton L LE compared to R LE.  Pt required standing/sitting rest breaks throughout session                            PT Education - 01/06/15 0917    Education provided Yes   Education Details plan of care and HEP   Person(s) Educated Patient   Methods Explanation   Comprehension Verbalized understanding             PT Long Term Goals - 12/31/14 1527    PT LONG TERM GOAL #1   Title pt's 5x sit to stand will improve by 8 seconds indicating improved functional LE strength for improved transfer ability    Baseline in 27.1 seonds with UE assist on eval (10/5)   Time 4   Period Weeks   Status New   PT LONG TERM GOAL #2   Title pt will ambulate with a gaist speed at least 0.8 m/s with LRAD  to ambulate safely in the community   Baseline 0.62 m/s with rollator on 10/5   Time 4   Period Weeks   Status New   PT LONG TERM GOAL #3   Title pt's Berg balance score will improve by at least 8 points indicating decreased risk of falls    Baseline 32/56 on 10/5   Time 4   Period Weeks   Status New               Plan - 01/06/15 0919    Clinical Impression Statement Pt did really well today and was instructed on an HEP and techniques to decrease energy expenditure such as using placing a pillow or pillows in chairs to decrease transfer effort. Pt fatigues quickly after each exercise and requires rest breaks.    Pt will benefit from skilled therapeutic intervention in order to improve on the following deficits Postural dysfunction;Decreased strength;Decreased balance;Decreased activity tolerance;Abnormal gait   Rehab Potential Fair   Clinical Impairments Affecting Rehab  Potential terminal brain lymphoma    PT Frequency 2x / week   PT Duration 4 weeks   PT Treatment/Interventions Balance training;Neuromuscular re-education;Gait training;Therapeutic exercise;Manual techniques;Therapeutic activities;Energy conservation   PT Next Visit Plan progess HEP        Problem List There are no active problems to display for this patient.  Renford Dills, SPT This entire session was performed under direct supervision and direction of a licensed Chiropractor . I have personally read, edited and approve of the note as written. Gorden Harms. Tortorici, PT, DPT [phone]  Tortorici,Ashley 01/06/2015, 9:33 AM  [place] MAIN [facility] SERVICES [address] [place], [place], [address] Phone: [phone]   Fax:  [phone]

## 2015-01-12 ENCOUNTER — Ambulatory Visit: Payer: Medicare Other

## 2015-01-14 ENCOUNTER — Ambulatory Visit: Payer: Medicare Other

## 2015-01-14 DIAGNOSIS — R531 Weakness: Secondary | ICD-10-CM | POA: Diagnosis not present

## 2015-01-14 DIAGNOSIS — R2681 Unsteadiness on feet: Secondary | ICD-10-CM

## 2015-01-14 NOTE — Therapy (Signed)
[place] MAIN [facility] SERVICES [address] [place], [place], [address] Phone: [phone]   Fax:  [phone]  Physical Therapy Treatment  Patient Details  Name: Sarah Wyatt MRN: [id number] Date of Birth: 10-30-1940 No Data Recorded  Encounter Date: 01/14/2015      PT End of Session - 01/14/15 1513    Visit Number 3   Number of Visits 9   Date for PT Re-Evaluation 01/27/15   Authorization Type 3/10 g codes   PT Start Time 3295   PT Stop Time 1445   PT Time Calculation (min) 60 min   Equipment Utilized During Treatment Gait belt   Activity Tolerance Patient limited by fatigue;Patient limited by pain   Behavior During Therapy [facility] for tasks assessed/performed      Past Medical History  Diagnosis Date  . Hypertension   . Hyperlipidemia   . Myelodysplastic disease ([place])   . Stroke [facility])     Past Surgical History  Procedure Laterality Date  . Tubal ligation    . Shoulder surgery Left     There were no vitals filed for this visit.  Visit Diagnosis:  Weakness generalized  Unsteadiness on feet      Subjective Assessment - 01/14/15 1511    Subjective Pt has not been doing since her last visit and has been experiencing severe pain in her shoulders to the point that she had to go to the ER over the weekend.  Pt has been tapering off her steroid medication over the past 3 weeks and her last dose was early this week.  Pt has not been physically active due to fatigue and pain.  pt currently has 5-6/10 pain in her shoulders.  Pt has been using heating pads to decrease her pain. pt has been having increased swelling in her legs lately, especially in her L LE.     Patient Stated Goals would like to regain as much as strength as she can   Currently in Pain? Yes   Pain Score 6    Pain Location Shoulder   Pain Orientation Left;Right   Pain Descriptors / Indicators Aching;Sharp      There ex: Moist heat x10 to upper back during  exercises: no charge Quad sets 2x10 with 3 sec hold each LE Ankle pumps 2x10 glute sets 2x10 Heel slides 2x10  In sitting: Scapular retractions 2x10, pt required tactile cueing to decrease posterior lean Manual resisted trunk to engage trunk musculature x10 with 3 sec hold Marching with manual resistance x10 each LE Knee extension with manual resistance x10 each LE Knee flexion with yellow band x10 each LE Hip abduction with yellow band x10 Hipp adduction with green ball x10 with 3 sec hold  In standing:within rolling walker  Heel raises x10 with  Marching in place x10  Static standing with NBOS with horizontal and vertical head turns x 2 min  Pt required verbal, visual and tactile cues for correct exercise technique.  Pt required close supervision for safety Pt required rest breaks between sets  Pt required min assist during bed mobility  SPT performed Homans test to L calf which was negative. Pt does have pain and swelling in the area.                             PT Education - 01/14/15 1512    Education provided Yes   Education Details pt instructed on recument exercises  and sitting exercises for HEP and to seek medical attention if her left knee gets worse, especially if she starts to experience pain in standing/walking   Person(s) Educated Patient   Methods Explanation   Comprehension Verbalized understanding             PT Long Term Goals - 12/31/14 1527    PT LONG TERM GOAL #1   Title pt's 5x sit to stand will improve by 8 seconds indicating improved functional LE strength for improved transfer ability    Baseline in 27.1 seonds with UE assist on eval (10/5)   Time 4   Period Weeks   Status New   PT LONG TERM GOAL #2   Title pt will ambulate with a gaist speed at least 0.8 m/s with LRAD to ambulate safely in the community   Baseline 0.62 m/s with rollator on 10/5   Time 4   Period Weeks   Status New   PT LONG TERM GOAL #3   Title  pt's Berg balance score will improve by at least 8 points indicating decreased risk of falls    Baseline 32/56 on 10/5   Time 4   Period Weeks   Status New               Plan - 01/14/15 1520    Clinical Impression Statement SPT modified session to focus on recumbent and sitting exercises due to patient being in pain and feeling poorly.  Pt was able to perform exercises but required rest breaks between sets due to experiencing fatigue and decreased exercise technique.  pt ambulated around the gym with decreased cadence and stride length compared to her last session.  pt instructed to seek medical attention if L LE swelling worsens or if she begins to experience pain and pain to touch.    Pt will benefit from skilled therapeutic intervention in order to improve on the following deficits Postural dysfunction;Decreased strength;Decreased balance;Decreased activity tolerance;Abnormal gait   Rehab Potential Fair   Clinical Impairments Affecting Rehab Potential terminal brain lymphoma    PT Frequency 2x / week   PT Duration 4 weeks   PT Treatment/Interventions Balance training;Neuromuscular re-education;Gait training;Therapeutic exercise;Manual techniques;Therapeutic activities;Energy conservation   PT Next Visit Plan progess HEP        Problem List There are no active problems to display for this patient.  Renford Dills, SPT This entire session was performed under direct supervision and direction of a licensed Chiropractor . I have personally read, edited and approve of the note as written. Gorden Harms. Tortorici, PT, DPT [phone]  Tortorici,Ashley 01/14/2015, 5:10 PM  [place] MAIN [facility] SERVICES [address] [place], [place], [address] Phone: [phone]   Fax:  [phone]  Name: [name] Dowson MRN: [id number] Date of Birth: 1941-03-14

## 2015-01-15 ENCOUNTER — Other Ambulatory Visit: Payer: Self-pay | Admitting: Nurse Practitioner

## 2015-01-15 ENCOUNTER — Ambulatory Visit
Admission: RE | Admit: 2015-01-15 | Discharge: 2015-01-15 | Disposition: A | Discharge status: Home / Self Care | Payer: Medicare Other | Source: Ambulatory Visit | Attending: Nurse Practitioner | Admitting: Nurse Practitioner

## 2015-01-15 DIAGNOSIS — M7989 Other specified soft tissue disorders: Secondary | ICD-10-CM

## 2015-01-15 DIAGNOSIS — M79662 Pain in left lower leg: Secondary | ICD-10-CM | POA: Diagnosis present

## 2015-01-15 DIAGNOSIS — I824Z2 Acute embolism and thrombosis of unspecified deep veins of left distal lower extremity: Secondary | ICD-10-CM | POA: Diagnosis not present

## 2015-01-20 ENCOUNTER — Ambulatory Visit: Payer: Medicare Other

## 2015-01-20 DIAGNOSIS — R2681 Unsteadiness on feet: Secondary | ICD-10-CM

## 2015-01-20 DIAGNOSIS — R531 Weakness: Secondary | ICD-10-CM | POA: Diagnosis not present

## 2015-01-21 NOTE — Therapy (Signed)
[place] MAIN [facility] SERVICES [address] [place], [place], [address] Phone: [phone]   Fax:  [phone]  Physical Therapy Treatment  Patient Details  Name: Sarah Wyatt MRN: [id number] Date of Birth: Jul 20, 1940 No Data Recorded  Encounter Date: 01/20/2015      PT End of Session - 01/21/15 0904    Visit Number 4   Number of Visits 9   Date for PT Re-Evaluation 01/27/15   Authorization Type 4/10 g codes   PT Start Time 1300   PT Stop Time 1345   PT Time Calculation (min) 45 min   Equipment Utilized During Treatment Gait belt   Activity Tolerance Patient limited by fatigue;Patient limited by pain   Behavior During Therapy [facility] for tasks assessed/performed      Past Medical History  Diagnosis Date  . Hypertension   . Hyperlipidemia   . Myelodysplastic disease ([place])   . Stroke [facility])     Past Surgical History  Procedure Laterality Date  . Tubal ligation    . Shoulder surgery Left     There were no vitals filed for this visit.  Visit Diagnosis:  Weakness generalized  Unsteadiness on feet      Subjective Assessment - 01/21/15 0903    Subjective Pt has had some "ups/ and downs" since her last visit.  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 She is currently having 3/10 in all her "joints", especially in her upper back.     Patient Stated Goals would like to regain as much as strength as she can   Pain Score 3    Pain Location --  all over   Pain Descriptors / Indicators Aching      There ex: Nu step x5 min level 0 R and L TKE with red band x10 Forward waling in //bars x1 lap Side stepping in //bars x2 laps, pt required cuing to lift her feet versus dragging her feet and to decrease trunk lean on //bars Rest break, while performing scapular retractions 2x10 Standing hip flexion x10 each LE, pt  required verbal cueing to perform exercise at slower pace to improve control and decrease use of momentum to perform exercise Rest break Standing hip abduction x10 each LE, pt required verbal and visual cueing to keep toes pointing forward and decrease trunk lean  Rest break Standing hip extension x10 each LE, pt experienced difficulty maintaining L LE straight and required verbal cueing to maintain upright posture versus forward trunk lean and with neck flexed  Rest break Standing marching in place x10 each LE Rest break, while performing chin tucks x10, was deferred after experiencing increased pain in her lower cervical/upper thoracic spine Pt was fatigued after there ex and expressed increased tension/pain in her upper back Pt educated about decreasing cervical flexion and forward head posture to decrease stress on her upper track musculature and spine   Pt required 1-3 minute rest breaks   Manual therapy: Soft tissue mobilization to right upper trap/levator scapulae to decrease myofascial trigger point restriction x 2 min.  pt reported decreased pain/tension after manual therapy                             PT Education - 01/21/15 0903    Education provided Yes   Education Details standing exercises,  how forward head posture and flexed neck is likely contributing to her upper back pain   Person(s) Educated Patient   Methods Explanation   Comprehension Verbalized understanding             PT Long Term Goals - 12/31/14 1527    PT LONG TERM GOAL #1   Title pt's 5x sit to stand will improve by 8 seconds indicating improved functional LE strength for improved transfer ability    Baseline in 27.1 seonds with UE assist on eval (10/5)   Time 4   Period Weeks   Status New   PT LONG TERM GOAL #2   Title pt will ambulate with a gaist speed at least 0.8 m/s with LRAD to ambulate safely in the community   Baseline 0.62 m/s with rollator on 10/5   Time 4    Period Weeks   Status New   PT LONG TERM GOAL #3   Title pt's Berg balance score will improve by at least 8 points indicating decreased risk of falls    Baseline 32/56 on 10/5   Time 4   Period Weeks   Status New               Plan - 01/21/15 0905    Clinical Impression Statement Pt was able to perform standing exercises today but required frequent rest breaks throughout session due to fatigue and increased pain.  Pt required mod-max cueing to decrease forward head posture and from maintaining her head flexed throughout session to decrease pain in her upper back.  Pt was fatigued at the end of the session and ambulated with decreased cadence/speed at the end of the session.    Pt will benefit from skilled therapeutic intervention in order to improve on the following deficits Postural dysfunction;Decreased strength;Decreased balance;Decreased activity tolerance;Abnormal gait   Rehab Potential Fair   Clinical Impairments Affecting Rehab Potential terminal brain lymphoma    PT Frequency 2x / week   PT Duration 4 weeks   PT Treatment/Interventions Balance training;Neuromuscular re-education;Gait training;Therapeutic exercise;Manual techniques;Therapeutic activities;Energy conservation   PT Next Visit Plan progress note        Problem List There are no active problems to display for this patient.  Renford Dills, SPT This entire session was performed under direct supervision and direction of a licensed Chiropractor . I have personally read, edited and approve of the note as written. Gorden Harms. Tortorici, PT, DPT [phone]  Tortorici,Ashley 01/21/2015, 2:07 PM  [place] MAIN [facility] SERVICES [address] [place], [place], [address] Phone: [phone]   Fax:  [phone]  Name: Sarah Wyatt MRN: [id number] Date of Birth: 21-Apr-1940

## 2015-01-26 ENCOUNTER — Ambulatory Visit: Payer: Medicare Other

## 2015-01-28 ENCOUNTER — Ambulatory Visit: Payer: Medicare Other | Attending: Nurse Practitioner

## 2015-01-28 DIAGNOSIS — R531 Weakness: Secondary | ICD-10-CM | POA: Diagnosis not present

## 2015-01-28 DIAGNOSIS — R278 Other lack of coordination: Secondary | ICD-10-CM | POA: Diagnosis present

## 2015-01-28 DIAGNOSIS — R2681 Unsteadiness on feet: Secondary | ICD-10-CM | POA: Diagnosis present

## 2015-01-28 NOTE — Therapy (Addendum)
[name] [facility] SERVICES [address] [place], [place], [address] Phone: [phone]   Fax:  [phone]  Physical Therapy Treatment/ DC summary  Patient Details  Name: Sarah Wyatt MRN: [id number] Date of Birth: 05/20/1940 No Data Recorded  Encounter Date: 01/28/2015      PT End of Session - 01/28/15 1734    Visit Number 5   Number of Visits 9   Date for PT Re-Evaluation 01/27/15   Authorization Type 5/10 g codes   PT Start Time 1300   PT Stop Time 1345   PT Time Calculation (min) 45 min   Equipment Utilized During Treatment Gait belt   Activity Tolerance Patient limited by fatigue   Behavior During Therapy [facility] for tasks assessed/performed      Past Medical History  Diagnosis Date  . Hypertension   . Hyperlipidemia   . Myelodysplastic disease ([place])   . Stroke [facility])     Past Surgical History  Procedure Laterality Date  . Tubal ligation    . Shoulder surgery Left     There were no vitals filed for this visit.  Visit Diagnosis:  Weakness generalized  Unsteadiness on feet      Subjective Assessment - 01/28/15 1734    Subjective Pt was feeling under the weather last week from GI problems.  pt had appointments all day yesterday and was exhausted by the end of the day from all the walking.  Pt reports her tumor is shrinking and everything else looks fine.  Pt has been experiencing less pain in her shoulders and denies any pain currently, just fatigued.  Pt feels she has made at least 50% improvement since the start of PT but has noticed her level of independence decreased after discontinuing steroid medication.  Pt will be receiving OT services soon to address UE problems to improve quality of life.     Patient Stated Goals would like to regain as much as strength as she can   Currently in Pain? No/denies   Pain Score 0-No pain    SPT reassessed outcome measures and progress towards goals there ex: Outcome measures  were reassessed to assess progress towards PT goals 5x sit to stand: 38.61 sec 10 MWT: 0.42 m/s Berg: 29/56                            PT Education - 01/28/15 1734    Education provided Yes   Education Details plan of care, outcome measures    Person(s) Educated Patient   Methods Explanation   Comprehension Verbalized understanding             PT Long Term Goals - 01/28/15 1735    PT LONG TERM GOAL #1   Title pt's 5x sit to stand will improve by 8 seconds indicating improved functional LE strength for improved transfer ability    Baseline in 27.1 seonds with UE assist on eval (10/5); 38.61 seconds on 11/03   Time 4   Period Weeks   Status Not Met   PT LONG TERM GOAL #2   Title pt will ambulate with a gaist speed at least 0.8 m/s with LRAD to ambulate safely in the community   Baseline 0.62 m/s with rollator on 10/5; 0.42 m/s on 11/03   Time 4   Period Weeks   Status Not Met   PT LONG TERM GOAL #3   Title pt's Berg balance score  will improve by at least 8 points indicating decreased risk of falls    Baseline 32/56 on 10/5; 29/56 on February 19, 2023   Time 4   Period Weeks   Status Not Met               Plan - 19-Feb-2015 1744    Clinical Impression Statement Since the start of PT, pt has not been able to consistently attend PT due to health reasons, such as feeling ill, experiencing increased pain but after discontinued steroid medication and experiencing increased fatigue at home.  pt's performance on outcome measures decreased compared to what she did on her evaluation and thus did not meet her goals for PT. PT is not able to tolerate PT activity in order to be beneficial at this time due to fatigue and pain.  PT will be discharged with an HEP and instruction to stay as active as possible to maintain and improve currently level of function.  Pt verbalizes agreement with POC.    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    PT Treatment/Interventions Balance training;Neuromuscular re-education;Gait training;Therapeutic exercise;Manual techniques;Therapeutic activities;Energy conservation          G-Codes - 02/19/2015 1744    Functional Assessment Tool Used history, clinical judgment, outcome measures   Functional Limitation Mobility: Walking and moving around   Mobility: Walking and Moving Around Current Status [phone]) At least 20 percent but less than 40 percent impaired, limited or restricted   Mobility: Walking and Moving Around Goal Status [phone]) At least 20 percent but less than 40 percent impaired, limited or restricted   Mobility: Walking and Moving Around Discharge Status [phone]) At least 20 percent but less than 40 percent impaired, limited or restricted      Problem List There are no active problems to display for this patient.  Renford Dills, SPT This entire session was performed under direct supervision and direction of a licensed Chiropractor . I have personally read, edited and approve of the note as written. Gorden Harms. Tortorici, PT, DPT [phone] 02/01/15 830A Renford Dills February 19, 2015, 5:45 PM  [place] MAIN [facility] SERVICES [address] [place], [place], [address] Phone: [phone]   Fax:  [phone]  Name: Sarah Wyatt MRN: [id number] Date of Birth: 1940/11/03

## 2015-02-02 ENCOUNTER — Ambulatory Visit: Payer: Medicare Other

## 2015-02-02 ENCOUNTER — Ambulatory Visit: Payer: Medicare Other | Admitting: Occupational Therapy

## 2015-02-02 ENCOUNTER — Encounter: Payer: Self-pay | Admitting: Occupational Therapy

## 2015-02-02 DIAGNOSIS — R531 Weakness: Secondary | ICD-10-CM | POA: Diagnosis not present

## 2015-02-02 DIAGNOSIS — R278 Other lack of coordination: Secondary | ICD-10-CM

## 2015-02-02 NOTE — Therapy (Signed)
[place] MAIN [facility] SERVICES [address] [place], [place], [address] Phone: [phone]   Fax:  [phone]  Occupational Therapy Evaluation  Patient Details  Name: Sarah Wyatt MRN: [id number] Date of Birth: 09-25-1940 No Data Recorded  Encounter Date: 02/02/2015      OT End of Session - 02/02/15 1355    Visit Number 1   Number of Visits 16   Date for OT Re-Evaluation 03/30/15   OT Start Time 1340   Behavior During Therapy [facility] for tasks assessed/performed      Past Medical History  Diagnosis Date  . Hypertension   . Hyperlipidemia   . Myelodysplastic disease ([place])   . Stroke [facility])     Past Surgical History  Procedure Laterality Date  . Tubal ligation    . Shoulder surgery Left     There were no vitals filed for this visit.  Visit Diagnosis:  Muscular incoordination - Plan: Ot plan of care cert/re-cert  Weakness generalized - Plan: Ot plan of care cert/re-cert      Subjective Assessment - 02/02/15 1347    Subjective  I have not bounced back. Every day tasks are harder.   Pertinent History 74 year old female who has cancer, has been having a hard time with ADL.   Patient Stated Goals To do better with her ADL   Currently in Pain? 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 She lives with her son and daughter in law and had been independent with basic ADL but now is having trouble with dressing and other BADL. She has strength and sensory deficits which are interfering with activities of daily living. She will benefit from Occupational Therapy for ADL/functioal mobility training and  upper extremity restoration. Practiced ADL with hip kit for lower body dressing and patient felt this is helpful.                             OT Long Term Goals - 02/02/15 1412    OT LONG  TERM GOAL #1   Title Patient having trouble putting dishes away in the cabnet.   Baseline Unable   Time 8   Period Weeks   Status New   OT LONG TERM GOAL #2   Title Patient will fully dress self with or with out assistive devices.   Baseline Much more trouble dressing lower body, gets short of breath.   Time 8   Period Weeks   Status New   OT LONG TERM GOAL #3   Title Patient will be able to button buttons with or with out assistive devices.   Baseline needs assist.   Time 8   Period Weeks   Status New   OT LONG TERM GOAL #4   Title Patient will be able to zip up zippers   Baseline needs assist   Time 8   Period Weeks   Status New   OT LONG TERM GOAL #5   Title Will be able to donn coat   Baseline needs assist   Time 8   Period Weeks   Status New               Plan - 02/02/15 1356    Clinical Impression Statement This patient  is a 74 year old female who came to [facility] out patient she has had a CVA and lymphoma of the brain.Bilateral shoulder flexion 0-140o, Strenght 3+/5 Distal motions are WNL and strength is 4-/5.  Grip R 34 lbs L 18 lbs.stereognosis, light touch, sharp, and temp are all diminished in both hands. 9 hole peg test R 37 seconds, L 41 seconds.          G-Codes - February 21, 2015 1420    Functional Assessment Tool Used clinical judgement   Functional Limitation Self care   Self Care Current Status [phone]) At least 20 percent but less than 40 percent impaired, limited or restricted   Self Care Goal Status (R1540) At least 60 percent but less than 80 percent impaired, limited or restricted      Problem List There are no active problems to display for this patient.  Sharon Mt, MS/OTR/L  Sharon Mt 21-Feb-2015, 3:21 PM  [place] MAIN [facility] SERVICES [address] [place], [place], [address] Phone: [phone]   Fax:  [phone]  Name: Sarah Wyatt MRN: [id number] Date of Birth: July 08, 1940

## 2015-02-02 NOTE — Patient Instructions (Signed)
Energy saving techniques.

## 2015-02-04 ENCOUNTER — Ambulatory Visit: Payer: Medicare Other | Admitting: Occupational Therapy

## 2015-02-04 ENCOUNTER — Encounter: Payer: Self-pay | Admitting: Occupational Therapy

## 2015-02-04 ENCOUNTER — Ambulatory Visit: Payer: Medicare Other

## 2015-02-04 DIAGNOSIS — R531 Weakness: Secondary | ICD-10-CM

## 2015-02-04 DIAGNOSIS — R278 Other lack of coordination: Secondary | ICD-10-CM

## 2015-02-04 NOTE — Therapy (Signed)
[place] MAIN [facility] SERVICES [place], [place], [address] Phone: [phone]   Fax:  [phone]  Occupational Therapy Treatment  Patient Details  Name: Sarah Wyatt MRN: [id number] Date of Birth: 12/03/1940 No Data Recorded  Encounter Date: 02/04/2015    Past Medical History  Diagnosis Date  . Hypertension   . Hyperlipidemia   . Myelodysplastic disease ([place])   . Stroke [facility])     Past Surgical History  Procedure Laterality Date  . Tubal ligation    . Shoulder surgery Left     There were no vitals filed for this visit.  Visit Diagnosis:  Weakness generalized  Muscular incoordination   Used 3, 2, and 1 lb weight starting with heaviest to lightest for Bilateral upper extremity  exercises shoulder flexion, elbow flexion and extension, forearm supination and pronation, wrist flexion and extension. Used color coded clips from hardest to easiest for pinch both hands. Used grooved peg board placing pegs in order and removed alternating fingers. Completed this with both hands.  During rest breaks discussed energy saving techniques during activities of daily living.                               OT Long Term Goals - 02/02/15 1412    OT LONG TERM GOAL #1   Title Patient having trouble putting dishes away in the cabnet.   Baseline Unable   Time 8   Period Weeks   Status New   OT LONG TERM GOAL #2   Title Patient will fully dress self with or with out assistive devices.   Baseline Much more trouble dressing lower body, gets short of breath.   Time 8   Period Weeks   Status New   OT LONG TERM GOAL #3   Title Patient will be able to button buttons with or with out assistive devices.   Baseline needs assist.   Time 8   Period Weeks   Status New   OT LONG TERM GOAL #4   Title Patient will be able to zip up zippers   Baseline needs assist   Time 8   Period Weeks   Status New   OT LONG TERM  GOAL #5   Title Will be able to donn coat   Baseline needs assist   Time 8   Period Weeks   Status New               Plan - 05/14/15 1507    Clinical Impression Statement Patient slowly tolerating more activity despite neuropathy.   Pt will benefit from skilled therapeutic intervention in order to improve on the following deficits (Retired) Decreased coordination;Decreased endurance;Decreased strength   Rehab Potential Good   OT Frequency 2x / week   OT Duration 12 weeks   OT Treatment/Interventions Self-care/ADL training;DME and/or AE instruction;Energy conservation;Neuromuscular education;Therapeutic exercise   Consulted and Agree with Plan of Care Patient        Problem List There are no active problems to display for this patient.  Sharon Mt, MS/OTR/L  Sharon Mt 05/14/2015, 3:11 PM  [place] MAIN [facility] SERVICES [address] [place], [place], [address] Phone: [phone]   Fax:  [phone]  Name: Sarah Wyatt MRN: [id number] Date of Birth: [birth year]

## 2015-02-04 NOTE — Patient Instructions (Signed)
Instructed in energy saving techniques while washing dishes.

## 2015-02-09 ENCOUNTER — Ambulatory Visit: Payer: Medicare Other | Admitting: Occupational Therapy

## 2015-02-09 ENCOUNTER — Encounter: Payer: Self-pay | Admitting: Occupational Therapy

## 2015-02-09 DIAGNOSIS — R531 Weakness: Secondary | ICD-10-CM

## 2015-02-09 DIAGNOSIS — R278 Other lack of coordination: Secondary | ICD-10-CM

## 2015-02-09 NOTE — Therapy (Signed)
[place] MAIN [facility] SERVICES [address] [place], [place], [address] Phone: [phone]   Fax:  [phone]  Occupational Therapy Treatment  Patient Details  Name: Sarah Wyatt MRN: [id number] Date of Birth: October 14, 1940 No Data Recorded  Encounter Date: 02/09/2015      OT End of Session - 02/09/15 1442    Visit Number 3   Number of Visits 16   Date for OT Re-Evaluation 03/30/15      Past Medical History  Diagnosis Date  . Hypertension   . Hyperlipidemia   . Myelodysplastic disease ([place])   . Stroke [facility])     Past Surgical History  Procedure Laterality Date  . Tubal ligation    . Shoulder surgery Left     There were no vitals filed for this visit.  Visit Diagnosis:  Weakness generalized  Muscular incoordination      Subjective Assessment - 02/09/15 1441    Subjective  I was not as soar as I thought I was going to be. I did several loads of laundry.   Pertinent History 74 year old female who has cancer, has been having a hard time with ADL.    Gripper set on 28.9, 23.4, 17.0, 11.2, and 6.6 lbs x 7 reps each, each hand, 3, 2, and 1 lb weights for B upper extremity  exercises shoulder flexion, elbow flexion and extension, forearm supination and pronation, wrist flexion and extension, and hand flexion and extension 7 repetitions. Completed card flipping with fingers only needed  Many cues for technique.                                OT Long Term Goals - 02/02/15 1412    OT LONG TERM GOAL #1   Title Patient having trouble putting dishes away in the cabnet.   Baseline Unable   Time 8   Period Weeks   Status New   OT LONG TERM GOAL #2   Title Patient will fully dress self with or with out assistive devices.   Baseline Much more trouble dressing lower body, gets short of breath.   Time 8   Period Weeks   Status New   OT LONG TERM GOAL #3   Title Patient will be able to button buttons with or  with out assistive devices.   Baseline needs assist.   Time 8   Period Weeks   Status New   OT LONG TERM GOAL #4   Title Patient will be able to zip up zippers   Baseline needs assist   Time 8   Period Weeks   Status New   OT LONG TERM GOAL #5   Title Will be able to donn coat   Baseline needs assist   Time 8   Period Weeks   Status New               Plan - 02/09/15 1442    Clinical Impression Statement Patient tolerated more reps during her exercises today.        Problem List There are no active problems to display for this patient.  Sharon Mt, MS/OTR/L  Sharon Mt 02/09/2015, 2:50 PM  [place] MAIN [facility] SERVICES [address] [place], [place], [address] Phone: [phone]   Fax:  [phone]  Name: Chelese Landers MRN: [id number] Date of Birth: 29-Jun-1940

## 2015-02-11 ENCOUNTER — Ambulatory Visit: Payer: Medicare Other | Admitting: Occupational Therapy

## 2015-02-17 ENCOUNTER — Encounter: Payer: Medicare Other | Admitting: Occupational Therapy

## 2015-02-23 ENCOUNTER — Encounter: Payer: Medicare Other | Admitting: Occupational Therapy

## 2015-02-25 ENCOUNTER — Ambulatory Visit: Payer: Self-pay | Admitting: Internal Medicine

## 2015-02-25 ENCOUNTER — Other Ambulatory Visit: Payer: Self-pay

## 2015-03-02 ENCOUNTER — Ambulatory Visit: Payer: Medicare Other | Admitting: Occupational Therapy

## 2015-03-04 ENCOUNTER — Encounter: Payer: Medicare Other | Admitting: Occupational Therapy

## 2015-03-09 ENCOUNTER — Encounter: Payer: Medicare Other | Admitting: Occupational Therapy

## 2015-03-10 ENCOUNTER — Encounter: Payer: Medicare Other | Admitting: Occupational Therapy

## 2015-03-11 ENCOUNTER — Encounter: Payer: Medicare Other | Admitting: Occupational Therapy

## 2015-03-15 ENCOUNTER — Encounter: Payer: Medicare Other | Admitting: Occupational Therapy

## 2016-01-12 ENCOUNTER — Ambulatory Visit: Payer: Medicare Other | Attending: Nurse Practitioner | Admitting: Physical Therapy

## 2016-01-12 ENCOUNTER — Encounter: Payer: Self-pay | Admitting: Physical Therapy

## 2016-01-12 DIAGNOSIS — R262 Difficulty in walking, not elsewhere classified: Secondary | ICD-10-CM | POA: Insufficient documentation

## 2016-01-12 DIAGNOSIS — M6281 Muscle weakness (generalized): Secondary | ICD-10-CM

## 2016-01-12 DIAGNOSIS — R293 Abnormal posture: Secondary | ICD-10-CM | POA: Diagnosis present

## 2016-01-12 NOTE — Therapy (Signed)
[place] [facility] [address] [place], [place], [address] Phone: [phone]   Fax:  [phone]  Physical Therapy Evaluation  Patient Details  Name: Sarah Wyatt MRN: [id number] Date of Birth: 04/21/1940 Referring Provider: Sallee Lange, NP  Encounter Date: 01/12/2016      PT End of Session - 01/12/16 1230    Visit Number 1   Number of Visits 8   Date for PT Re-Evaluation 02/08/16   Authorization - Visit Number 1   Authorization - Number of Visits 10   PT Start Time 0850   PT Stop Time 0946   PT Time Calculation (min) 56 min   Activity Tolerance Patient tolerated treatment well   Behavior During Therapy [facility] for tasks assessed/performed      Past Medical History:  Diagnosis Date  . Hyperlipidemia   . Hypertension   . Myelodysplastic disease ([place])   . Stroke [facility])     Past Surgical History:  Procedure Laterality Date  . SHOULDER SURGERY Left   . TUBAL LIGATION      There were no vitals filed for this visit.       Subjective Assessment - 01/12/16 1240    Subjective Pt states that she is seeking physical therapy to improve her fitness and endurance. States that she has been largely inactive since Nov/Dec of last year. She has had some family issues to deal with and has not been able to take care of herself as well as she would have liked. States that after her CVA she spent time in inpatient rehabilitation receiving physical, occupational and speech therapy. Pt notes that she regained most of her abilities back fairly quickly after her time in rehab. She sometimes has difficulty with short term memory and with vision due to macular degeneration. Pt denies hx of falls, but states that she uses her rollator for community distances. She ambulates independently when she is at home but feels more stable with furniture/wall "surfing" to support herself. Her hobbies including crafting and puzzles; reports difficulty with  these tasks due to decrease in tactile sensation on her hands and vision problems.    Pertinent History CVA 11/10/14; hx of myelodysplastic disease and lymphoma. Pt received OT/PT/SLP in inpatient rehab with sizeable gains. Pt has been largely inactive for the last year and is seeking PT to improve fitness/endurance and balance.   Limitations Lifting;Standing;Walking;House hold activities   How long can you walk comfortably? <10 minutes   Patient Stated Goals pt would like to improve her physical fitness and endurance   Currently in Pain? No/denies            [facility] PT Assessment - 01/12/16 0001      Assessment   Medical Diagnosis weakness, gait issues, lymphoma   Referring Provider Sallee Lange, NP   Onset Date/Surgical Date 01/26/15   Hand Dominance Right   Prior Therapy pt has had prior OT/PT/SLP following CVA     Objective:  Neuromuscular Re-ed: In // bars with SPT CGA/SBA: sit<>stand with UE assist (pt unable to complete task with no UE assist). Static standing balance 2 minutes with no UE assist. Static balance with narrow base of support 1 minute. Tandem stance multiple trials; pt able to achieve <10 sec consistently. Single leg stance; pt unable to achieve SLS for >2-3 seconds. Alternating feet on 6'' step x30 secs with on UE assist.   Reviewed HEP program. Discussed use of rollator as preferred assistive device at this  time to promote safety.  Pt response for medical necessity: Pt with good tolerance of PT evaluation session with no c/o of pain. She does not experience any LOB but is careful and slow with all movements. Pt will benefit from skilled physical therapy services to supervise a progressive balance and strengthening program to ensure safety and promote longterm mobility.      PT Education - 01/12/16 1229    Education provided Yes   Education Details see pt instructions for detailed HEP including: standing marches, seated LAQ, hip abd and bridge. Encouraged  use of rollator at this time to decrease fall risk   Person(s) Educated Patient   Methods Explanation;Demonstration;Handout   Comprehension Verbalized understanding;Need further instruction             PT Long Term Goals - 01/12/16 1256      PT LONG TERM GOAL #1   Title Pt will score >23/80 on LEFS to promote improved functional mobility   Baseline 10/18: 13/80   Time 4   Period Weeks   Status New     PT LONG TERM GOAL #2   Title Pt will score >42 on BERG balance assessment to promote independent function and decrease fall risk   Baseline 10/18: 37/56   Time 4   Period Weeks   Status New     PT LONG TERM GOAL #3   Title Pt will be able to tolerate >20 minutes of continued standing exercise in clinic to demonstrate improved endurance so she can assist with grandchildren caretaking duties   Baseline 10/18: pt can ambulate for <10 minutes with rollator before she experiences SOB and must sit   Time 4   Period Weeks   Status New     PT LONG TERM GOAL #4   Title Pt will improve her strength per MMT by 1/2 grade to promote independence with ADLs and improve tolerance of upright activity   Baseline 10/18: : MMT R/L LE hip flexion 3+/3+, knee ext 4+/4+, knee flexion 4/4, dorsiflexion 4+/4+, plantarflexion 4+/4+. Hip add/hip abd not tested formally; pt with mild-moderate weakness of hip abductors when tested in seated position   Time 4   Period Weeks   Status New               Plan - 01/12/16 1245    Clinical Impression Statement Pt is a pleasant 75 year old female referred to physical therapy for generalized weakness and gait instability. Vitals: BP 129/48, HR 63, O2 100. Pain: Pt states that she has regular bilateral shoulder pain and low back pain; she seeks steroid injections and takes pain medication for management of these sx. Gait: pt ambulates in clinic with a rollator; tendency for forward flexed posture when using rollator, limited heel strike/toe off and  decreased cadence. Strength: MMT R/L LE hip flexion 3+/3+, knee ext 4+/4+, knee flexion 4/4, dorsiflexion 4+/4+, plantarflexion 4+/4+. Hip add/hip abd not tested formally; pt with mild-moderate weakness of hip abductors when tested in seated position. Sensation: pt intact to LT but notes dulled sensation in hands and feet secondary to peripheral neuropathy. Outcome Measures: LEFS 13/80, BERG balance assessment: 37/56. Pt limited by strength deficits during BERG balance testing. She demonstrates difficulty with sit<>stand task requiring UE assist, reaching outside BOS, balance with narrow base of support and single leg stance tasks. Pt will benefit from skilled PT services to progress strength and balance program to ensure long term functional mobility, decrease fall risk and promote active lifestyle.  Rehab Potential Fair   PT Frequency 2x / week   PT Duration 4 weeks   PT Treatment/Interventions ADLs/Self Care Home Management;Aquatic Therapy;Biofeedback;Cryotherapy;Electrical Stimulation;Moist Heat;DME Instruction;Gait training;Stair training;Functional mobility training;Therapeutic activities;Therapeutic exercise;Balance training;Neuromuscular re-education;Patient/family education;Manual techniques   PT Next Visit Plan assess rollator/SPC height, progress strength/balance program, assess UE strength   PT Home Exercise Plan see pt instructions for LE strengthening exercises.   Consulted and Agree with Plan of Care Patient      Patient will benefit from skilled therapeutic intervention in order to improve the following deficits and impairments:  Abnormal gait, Decreased activity tolerance, Decreased balance, Decreased coordination, Decreased endurance, Decreased knowledge of use of DME, Decreased mobility, Decreased strength, Difficulty walking, Impaired sensation, Impaired UE functional use, Impaired vision/preception, Improper body mechanics, Pain  Visit Diagnosis: Muscle weakness  (generalized)  Difficulty in walking, not elsewhere classified  Abnormal posture      G-Codes - 2016/01/30 1407    Functional Assessment Tool Used Clinical impression/ Gait difficulty/ Muscle weakness/ Berg/ LEFS   Functional Limitation Mobility: Walking and moving around   Mobility: Walking and Moving Around Current Status [id number] At least 60 percent but less than 80 percent impaired, limited or restricted   Mobility: Walking and Moving Around Goal Status [phone]) At least 20 percent but less than 40 percent impaired, limited or restricted       Problem List There are no active problems to display for this patient.  Pura Spice, PT, DPT # [phone] Derrill Memo, SPT 01/30/16, 2:08 PM  [place] [facility] [address] [place], [place], [address] Phone: [phone]   Fax:  [phone]  Name: Sarah Wyatt MRN: [id number] Date of Birth: 08-16-1940

## 2016-01-18 ENCOUNTER — Ambulatory Visit: Payer: Medicare Other | Admitting: Physical Therapy

## 2016-01-18 ENCOUNTER — Encounter: Payer: Self-pay | Admitting: Physical Therapy

## 2016-01-18 DIAGNOSIS — R262 Difficulty in walking, not elsewhere classified: Secondary | ICD-10-CM

## 2016-01-18 DIAGNOSIS — M6281 Muscle weakness (generalized): Secondary | ICD-10-CM | POA: Diagnosis not present

## 2016-01-18 DIAGNOSIS — R293 Abnormal posture: Secondary | ICD-10-CM

## 2016-01-18 NOTE — Therapy (Signed)
[facility] [address] [place], [place], [address] Phone: [phone]   Fax:  [phone]  Physical Therapy Treatment  Patient Details  Name: Sarah Wyatt MRN: [id number] Date of Birth: 1941/03/02 Referring Provider: Sallee Lange, NP  Encounter Date: 01/18/2016      PT End of Session - 01/18/16 1218    Visit Number 2   Number of Visits 8   Date for PT Re-Evaluation 02/08/16   Authorization - Visit Number 2   Authorization - Number of Visits 10   PT Start Time 0849   PT Stop Time 0949   PT Time Calculation (min) 60 min   Activity Tolerance Patient tolerated treatment well;Patient limited by fatigue   Behavior During Therapy [facility] for tasks assessed/performed      Past Medical History:  Diagnosis Date  . Hyperlipidemia   . Hypertension   . Myelodysplastic disease ([place])   . Stroke [facility])     Past Surgical History:  Procedure Laterality Date  . SHOULDER SURGERY Left   . TUBAL LIGATION      There were no vitals filed for this visit.      Subjective Assessment - 01/18/16 1215    Subjective Pt states that she tried to perform her HEP plan; had difficulty with bridge exercise because of onset of back pain. Notes that she had a busy weekend full of household chores. Pt states that she sleeps in a recliner at home; she is unable to sleep flat on her back or on her side due to onset of low back and hip pain.    Pertinent History CVA 11/10/14; hx of myelodysplastic disease and lymphoma. Pt received OT/PT/SLP in inpatient rehab with sizeable gains. Pt has been largely inactive for the last year and is seeking PT to improve fitness/endurance and balance.   Limitations Lifting;Standing;Walking;House hold activities   How long can you walk comfortably? <10 minutes   Patient Stated Goals pt would like to improve her physical fitness and endurance   Currently in Pain? No/denies     Objective:  Nu Step 10 minutes Level 4  (warm up/no charge)  Therapeutic Exercise: Pt unable to tolerate positioning for clamshell/bridge in therapy clinic due to onset of moderate-severe low back and hip pain. Seated resisted knee flexion with YTB 3x10 R/L with emphasis on slow/controlled movements. Knee extension with #2.5 2x15; pt requires frequent cueing to move LE through entire knee ROM. Amb in clinic hallway using rollator with pt instructed to accentuate foot clearance through hip flexion with #2.5 (5 minutes). Standing heel raises with UE support on // bars x20; pt with decreased active ankle ROM. Standing hamstring curls with #2.5 2x15 R/L.   Neuromuscular Re-ed: In // bars with SPT CGA/SBA: Marching 29ft x4; pt with decreased step length/stance time (esp on LLE) with no UE assist. Lateral step with #2.5 ankle weights with UE assist 6ft x4, with no UE assist 22ft x8. Obstacle navigation x8: step over 2 3'' steps and 1 air ex pad; pt uses UE support prn during task with overall decreased confidence with no UE assist and tendency to support body weight with UE without frequent cueing.  Pt response for medical necessity: Pt demonstrates overall low energy level during PT session with resting HR 77, O2 99. She c/o of back/hip pain with supine/sidelying positioning but no c/o with standing/sitting exercises. Pt with reliance on UE support during stepping tasks; benefits from repetition of task/exercise to promote increased confidence  and success with novel tasks.       PT Long Term Goals - 01/12/16 1256      PT LONG TERM GOAL #1   Title Pt will score >23/80 on LEFS to promote improved functional mobility   Baseline 10/18: 13/80   Time 4   Period Weeks   Status New     PT LONG TERM GOAL #2   Title Pt will score >42 on BERG balance assessment to promote independent function and decrease fall risk   Baseline 10/18: 37/56   Time 4   Period Weeks   Status New     PT LONG TERM GOAL #3   Title Pt will be able to tolerate >20  minutes of continued standing exercise in clinic to demonstrate improved endurance so she can assist with grandchildren caretaking duties   Baseline 10/18: pt can ambulate for <10 minutes with rollator before she experiences SOB and must sit   Time 4   Period Weeks   Status New     PT LONG TERM GOAL #4   Title Pt will improve her strength per MMT by 1/2 grade to promote independence with ADLs and improve tolerance of upright activity   Baseline 10/18: : MMT R/L LE hip flexion 3+/3+, knee ext 4+/4+, knee flexion 4/4, dorsiflexion 4+/4+, plantarflexion 4+/4+. Hip add/hip abd not tested formally; pt with mild-moderate weakness of hip abductors when tested in seated position   Time 4   Period Weeks   Status New               Plan - 01/18/16 1218    Clinical Impression Statement Pt demonstrates slow, methodical movement strategies and requires increased time for tasks. She demonstrates inconsistent rollator management during session, with tendency to complete transfers with rollator pushed aside. She is responsive to cueing for improved rollator management but with poor carryover. Pt is unable to tolerate sidelying or supine positioning due to onset of moderate-severe low back/hip pain.    Rehab Potential Fair   PT Frequency 2x / week   PT Duration 4 weeks   PT Treatment/Interventions ADLs/Self Care Home Management;Aquatic Therapy;Biofeedback;Cryotherapy;Electrical Stimulation;Moist Heat;DME Instruction;Gait training;Stair training;Functional mobility training;Therapeutic activities;Therapeutic exercise;Balance training;Neuromuscular re-education;Patient/family education;Manual techniques   PT Next Visit Plan assess rollator/SPC height, progress strength/balance program, assess UE strength   PT Home Exercise Plan see pt instructions for LE strengthening exercises.   Consulted and Agree with Plan of Care Patient      Patient will benefit from skilled therapeutic intervention in order to  improve the following deficits and impairments:  Abnormal gait, Decreased activity tolerance, Decreased balance, Decreased coordination, Decreased endurance, Decreased knowledge of use of DME, Decreased mobility, Decreased strength, Difficulty walking, Impaired sensation, Impaired UE functional use, Impaired vision/preception, Improper body mechanics, Pain  Visit Diagnosis: Muscle weakness (generalized)  Difficulty in walking, not elsewhere classified  Abnormal posture     Problem List There are no active problems to display for this patient.  Pura Spice, PT, DPT # [phone] Mickel Baas Tushar Enns SPT 01/18/2016, 12:44 PM  [facility] [address] [place], [place], [address] Phone: [phone]   Fax:  [phone]  Name: Sandie Harke MRN: [id number] Date of Birth: [birth year]

## 2016-01-20 ENCOUNTER — Ambulatory Visit: Payer: Medicare Other | Admitting: Physical Therapy

## 2016-01-20 ENCOUNTER — Encounter: Payer: Self-pay | Admitting: Physical Therapy

## 2016-01-20 DIAGNOSIS — M6281 Muscle weakness (generalized): Secondary | ICD-10-CM | POA: Diagnosis not present

## 2016-01-20 DIAGNOSIS — R293 Abnormal posture: Secondary | ICD-10-CM

## 2016-01-20 DIAGNOSIS — R262 Difficulty in walking, not elsewhere classified: Secondary | ICD-10-CM

## 2016-01-20 NOTE — Therapy (Signed)
[facility] [address] [place], [place], [address] Phone: [phone]   Fax:  [phone]  Physical Therapy Treatment  Patient Details  Name: Sarah Wyatt MRN: [id number] Date of Birth: [birth year] Referring Provider: Sallee Lange, NP  Encounter Date: 01/20/2016      PT End of Session - 01/20/16 1243    Visit Number 3   Number of Visits 8   Date for PT Re-Evaluation 02/08/16   Authorization - Visit Number 3   Authorization - Number of Visits 10   PT Start Time 0850   PT Stop Time 0953   PT Time Calculation (min) 63 min   Activity Tolerance Patient tolerated treatment well;Patient limited by fatigue   Behavior During Therapy [facility] for tasks assessed/performed      Past Medical History:  Diagnosis Date  . Hyperlipidemia   . Hypertension   . Myelodysplastic disease ([place])   . Stroke [facility])     Past Surgical History:  Procedure Laterality Date  . SHOULDER SURGERY Left   . TUBAL LIGATION      There were no vitals filed for this visit.      Subjective Assessment - 01/20/16 1242    Subjective Pt states that she is feeling stiff; says this is normal for her in the morning to have difficulty getting moving. Reports that she doesn't always feel that she can "trust her legs" as her balance and endurance seems to fade after 3pm.    Pertinent History CVA 11/10/14; hx of myelodysplastic disease and lymphoma. Pt received OT/PT/SLP in inpatient rehab with sizeable gains. Pt has been largely inactive for the last year and is seeking PT to improve fitness/endurance and balance.   How long can you walk comfortably? <10 minutes   Patient Stated Goals pt would like to improve her physical fitness and endurance   Currently in Pain? No/denies      Objective:  Therapeutic Exercise: Nu Step 15 minutes Level 6 (warm up/no charge). In // bars with UE assist: hip flexion R/L #1 x30, repeated cueing for full hip flexion ROM, hip  flexion with ext #1 x30 R/L.  Neuromuscular Re-ed: In // bars with SPT CGA/SBA for all tasks: tandem walk 39ft x6 (pt with difficulty achieving tandem without UE assist; amb with partial tandem without frequent cueing; better understanding of task with tactile cueing "touch your heel to your toes when you walk."), lateral walks with no UE assist 76ft x8 with emphasis on increased step length and weight shift. Obstacle course negotiation over 4 '' planks and 1 air ex pad to facilitate increase in step length and emphasize dynamic balance x6. Static balance on air ex pad with narrow base of support 1 minute x2 (no UE assist.)  Pt response for medical necessity: Pt is fearful of falling with tasks requiring increased step length/height. She shows tendency to rely on UE support for novel tasks and requires frequent cueing to maintain exercise difficulty. Pt with no c/o of low back/hip pain this session; better tolerance of upright activity than supine.        PT Long Term Goals - 01/12/16 1256      PT LONG TERM GOAL #1   Title Pt will score >23/80 on LEFS to promote improved functional mobility   Baseline 10/18: 13/80   Time 4   Period Weeks   Status New     PT LONG TERM GOAL #2   Title Pt will score >42 on  BERG balance assessment to promote independent function and decrease fall risk   Baseline 10/18: 37/56   Time 4   Period Weeks   Status New     PT LONG TERM GOAL #3   Title Pt will be able to tolerate >20 minutes of continued standing exercise in clinic to demonstrate improved endurance so she can assist with grandchildren caretaking duties   Baseline 10/18: pt can ambulate for <10 minutes with rollator before she experiences SOB and must sit   Time 4   Period Weeks   Status New     PT LONG TERM GOAL #4   Title Pt will improve her strength per MMT by 1/2 grade to promote independence with ADLs and improve tolerance of upright activity   Baseline 10/18: : MMT R/L LE hip flexion 3+/3+,  knee ext 4+/4+, knee flexion 4/4, dorsiflexion 4+/4+, plantarflexion 4+/4+. Hip add/hip abd not tested formally; pt with mild-moderate weakness of hip abductors when tested in seated position   Time 4   Period Weeks   Status New               Plan - 01/20/16 1244    Clinical Impression Statement Pt with improved tolerance of exercise session with all activities performed in standing with no c/o of low back/hip pain. Pt with decreased step length during ambulation; minimally responsive to cueing for increased step length but able to navigate obstacles that enforce larger steps. She is fearful of falling and has a tendency to rely heavily on UE support for novel tasks. Pt with no major LOB; able to self correct on // bars without PT assist. Pt will benefit from therapy aimed at improving confidence/endurance with standing/stepping tasks.    Rehab Potential Fair   PT Frequency 2x / week   PT Duration 4 weeks   PT Treatment/Interventions ADLs/Self Care Home Management;Aquatic Therapy;Biofeedback;Cryotherapy;Electrical Stimulation;Moist Heat;DME Instruction;Gait training;Stair training;Functional mobility training;Therapeutic activities;Therapeutic exercise;Balance training;Neuromuscular re-education;Patient/family education;Manual techniques   PT Next Visit Plan progress strength/balance program, assess UE strength   PT Home Exercise Plan see pt instructions for LE strengthening exercises.   Consulted and Agree with Plan of Care Patient      Patient will benefit from skilled therapeutic intervention in order to improve the following deficits and impairments:  Abnormal gait, Decreased activity tolerance, Decreased balance, Decreased coordination, Decreased endurance, Decreased knowledge of use of DME, Decreased mobility, Decreased strength, Difficulty walking, Impaired sensation, Impaired UE functional use, Impaired vision/preception, Improper body mechanics, Pain  Visit Diagnosis: Muscle  weakness (generalized)  Difficulty in walking, not elsewhere classified  Abnormal posture     Problem List There are no active problems to display for this patient.  Pura Spice, PT, DPT # [phone] Mickel Baas Keslyn Teater SPT 01/20/2016, 12:53 PM  [facility] [address] [place], [place], [address] Phone: [phone]   Fax:  [phone]  Name: Sarah Wyatt MRN: [id number] Date of Birth: [birth year]

## 2016-01-25 ENCOUNTER — Encounter: Payer: Self-pay | Admitting: Physical Therapy

## 2016-01-25 ENCOUNTER — Ambulatory Visit: Payer: Medicare Other | Admitting: Physical Therapy

## 2016-01-25 DIAGNOSIS — M6281 Muscle weakness (generalized): Secondary | ICD-10-CM

## 2016-01-25 DIAGNOSIS — R262 Difficulty in walking, not elsewhere classified: Secondary | ICD-10-CM

## 2016-01-25 DIAGNOSIS — R293 Abnormal posture: Secondary | ICD-10-CM

## 2016-01-25 NOTE — Therapy (Signed)
[facility] [address] [place], [place], [address] Phone: [phone]   Fax:  [phone]  Physical Therapy Treatment  Patient Details  Name: Sarah Wyatt MRN: [id number] Date of Birth: 1941-01-03 Referring Provider: Sallee Lange, NP  Encounter Date: 01/25/2016      PT End of Session - 01/25/16 1251    Visit Number 4   Number of Visits 8   Date for PT Re-Evaluation 02/08/16   Authorization - Visit Number 4   Authorization - Number of Visits 10   PT Start Time 0849   PT Stop Time 0959   PT Time Calculation (min) 70 min   Activity Tolerance Patient tolerated treatment well   Behavior During Therapy [facility] for tasks assessed/performed      Past Medical History:  Diagnosis Date  . Hyperlipidemia   . Hypertension   . Myelodysplastic disease ([place])   . Stroke [facility])     Past Surgical History:  Procedure Laterality Date  . SHOULDER SURGERY Left   . TUBAL LIGATION      There were no vitals filed for this visit.      Subjective Assessment - 01/25/16 1249    Subjective Pt states that she had a painful/stiff day over the weekend; she feels this may be caused by change in weather. She is feeling better today other than her usual stiffness. Pt states that when navigating steps in her home (between rooms) she hangs onto the wall and is not completely confident acending/descending with rollator or independently.   Pertinent History CVA 11/10/14; hx of myelodysplastic disease and lymphoma. Pt received OT/PT/SLP in inpatient rehab with sizeable gains. Pt has been largely inactive for the last year and is seeking PT to improve fitness/endurance and balance.   Limitations Lifting;Standing;Walking;House hold activities   How long can you walk comfortably? <10 minutes   Patient Stated Goals pt would like to improve her physical fitness and endurance   Currently in Pain? No/denies     Objective:  Therapeutic Exercise: Nu Step  Level 4 10 Minutes (warm up/no charge). Seated hip abduction with red theraband at proximal knee (2x15 bilaterally). Explanation of long lever hip abduction in supine/pt to modify exercise in recliner at home as she cannot tolerate supine/sidelying position in the clinic without exacerbation of moderate-severe low back/hip pain. Lateral steps in // bars 16ft x6 with no UE assist; pt with difficulty maintain neutral foot with RLE as leading leg.   Neuromuscular Re-ed: Step up on 3'' step with BUE assist x10, unilateral UE assist x10, UE assist prn (hand hover) x8. Pt with tendency for posterior lean and slow ascend due to fear of falling; responsive to cueing for forward leaning and increased speed but demonstrates preference for UE assist. Tandem walk in // bars 98ft x6: pt with difficulty maintaining tandem walk due to foot placement with repeated cueing for heel to toe contact.   Gait training: In PT clinic on level surface with rollator; cueing for increased step length, emphasizing heel strike and toe off. Pt with improved foot clearance and step length when she manages consistent heel strike. Requires repeated cueing and has difficulty maintaining she is not focused on gait mechanics. Outside PT clinic on sloped surfaces with rollator; cueing for curb strategy with rollator and neutral posture.  Vitals: O2 98, HR 88 following initial gait training exercise.  Pt response for medical necessity: Pt with good activity tolerance this session. She sits after initial gait training  exercise and in between standing/sitting exercises but does not request seated rest break this session. She notes mild muscle soreness in knees and hips with gait activity but does not c/o of pain this session.       PT Education - 01/25/16 1251    Education provided Yes   Education Details see pt instructions: hip abduction strengthening    Person(s) Educated Patient   Methods Explanation;Demonstration;Handout    Comprehension Verbalized understanding;Returned demonstration             PT Long Term Goals - 01/12/16 1256      PT LONG TERM GOAL #1   Title Pt will score >23/80 on LEFS to promote improved functional mobility   Baseline 10/18: 13/80   Time 4   Period Weeks   Status New     PT LONG TERM GOAL #2   Title Pt will score >42 on BERG balance assessment to promote independent function and decrease fall risk   Baseline 10/18: 37/56   Time 4   Period Weeks   Status New     PT LONG TERM GOAL #3   Title Pt will be able to tolerate >20 minutes of continued standing exercise in clinic to demonstrate improved endurance so she can assist with grandchildren caretaking duties   Baseline 10/18: pt can ambulate for <10 minutes with rollator before she experiences SOB and must sit   Time 4   Period Weeks   Status New     PT LONG TERM GOAL #4   Title Pt will improve her strength per MMT by 1/2 grade to promote independence with ADLs and improve tolerance of upright activity   Baseline 10/18: : MMT R/L LE hip flexion 3+/3+, knee ext 4+/4+, knee flexion 4/4, dorsiflexion 4+/4+, plantarflexion 4+/4+. Hip add/hip abd not tested formally; pt with mild-moderate weakness of hip abductors when tested in seated position   Time 4   Period Weeks   Status New            Plan - 01/25/16 1252    Clinical Impression Statement Pt demonstrates tendency for decreased heel strike/toe off during gait which she states is a function of low activity level; she is responsive to cueing to emphasize heel strike and "roll of toe" with improved gait mechanics. Pt with tendency flat foot strike when she is not conscious of gait pattern. Demonstrates decreased R hip abduction strength as compared to L during standing abduction tasks.    Rehab Potential Fair   PT Frequency 2x / week   PT Duration 4 weeks   PT Treatment/Interventions ADLs/Self Care Home Management;Aquatic Therapy;Biofeedback;Cryotherapy;Electrical  Stimulation;Moist Heat;DME Instruction;Gait training;Stair training;Functional mobility training;Therapeutic activities;Therapeutic exercise;Balance training;Neuromuscular re-education;Patient/family education;Manual techniques   PT Next Visit Plan progress strength/balance program, assess UE strength   PT Home Exercise Plan see pt instructions for LE strengthening exercises.   Consulted and Agree with Plan of Care Patient      Patient will benefit from skilled therapeutic intervention in order to improve the following deficits and impairments:  Abnormal gait, Decreased activity tolerance, Decreased balance, Decreased coordination, Decreased endurance, Decreased knowledge of use of DME, Decreased mobility, Decreased strength, Difficulty walking, Impaired sensation, Impaired UE functional use, Impaired vision/preception, Improper body mechanics, Pain  Visit Diagnosis: Muscle weakness (generalized)  Difficulty in walking, not elsewhere classified  Abnormal posture     Problem List There are no active problems to display for this patient.  Pura Spice, PT, DPT # [phone] Mickel Baas Duvall Comes SPT  01/25/2016, 1:09 PM  [facility] [address] [place], [place], [address] Phone: [phone]   Fax:  [phone]  Name: Sarah Wyatt MRN: [id number] Date of Birth: Dec 26, 1940

## 2016-01-27 ENCOUNTER — Encounter: Payer: Self-pay | Admitting: Physical Therapy

## 2016-01-27 ENCOUNTER — Ambulatory Visit: Payer: Medicare Other | Attending: Nurse Practitioner | Admitting: Physical Therapy

## 2016-01-27 DIAGNOSIS — M6281 Muscle weakness (generalized): Secondary | ICD-10-CM | POA: Diagnosis present

## 2016-01-27 DIAGNOSIS — R531 Weakness: Secondary | ICD-10-CM | POA: Diagnosis present

## 2016-01-27 DIAGNOSIS — R293 Abnormal posture: Secondary | ICD-10-CM | POA: Diagnosis present

## 2016-01-27 DIAGNOSIS — R262 Difficulty in walking, not elsewhere classified: Secondary | ICD-10-CM | POA: Insufficient documentation

## 2016-01-27 DIAGNOSIS — R2681 Unsteadiness on feet: Secondary | ICD-10-CM | POA: Insufficient documentation

## 2016-01-27 DIAGNOSIS — R278 Other lack of coordination: Secondary | ICD-10-CM | POA: Diagnosis present

## 2016-01-27 NOTE — Therapy (Signed)
[facility] [address] [place], [place], [address] Phone: [phone]   Fax:  [phone]  Physical Therapy Treatment  Patient Details  Name: Sarah Wyatt MRN: [id number] Date of Birth: 1940/10/28 Referring Provider: Sallee Lange, NP  Encounter Date: 01/27/2016      PT End of Session - 01/27/16 1130    Visit Number 5   Number of Visits 8   Date for PT Re-Evaluation 02/08/16   Authorization - Visit Number 5   Authorization - Number of Visits 10   PT Start Time 0845   PT Stop Time 0943   PT Time Calculation (min) 58 min   Equipment Utilized During Treatment Gait belt   Activity Tolerance Patient tolerated treatment well;Patient limited by fatigue   Behavior During Therapy [facility] for tasks assessed/performed      Past Medical History:  Diagnosis Date  . Hyperlipidemia   . Hypertension   . Myelodysplastic disease ([place])   . Stroke [facility])     Past Surgical History:  Procedure Laterality Date  . SHOULDER SURGERY Left   . TUBAL LIGATION      There were no vitals filed for this visit.      Subjective Assessment - 01/27/16 1127    Subjective Pt states that she has not had her morning coffee yet but otherwise is feeling good. Reports no LOB or falls since last visit.   Pertinent History CVA 11/10/14; hx of myelodysplastic disease and lymphoma. Pt received OT/PT/SLP in inpatient rehab with sizeable gains. Pt has been largely inactive for the last year and is seeking PT to improve fitness/endurance and balance.   Limitations Lifting;Standing;Walking;House hold activities   How long can you walk comfortably? <10 minutes   Patient Stated Goals pt would like to improve her physical fitness and endurance   Currently in Pain? No/denies     Objective:   Therapeutic Exercise: sit<>stand on raised surface (chair + 2 air ex pad) with no UE assist 3x10. Seated knee extension with #2.5 reciprocal x30 R/L. Pt with difficulty  achieving full range knee extension; noted increased pain in R anterior knee with end range knee flexion. Seated hip flexion with #2.5 ankle weights reciprocal x30 R/L.    Neuromuscular Re-ed: In // bars with SPT CGA/SBA UE assist prn: marching with max hip flexion to increase SLS stance time 63ft x8. Marching with reciprocal UE touch 25ft x8; pt requires use of unilateral UE when performing this exercise. Functional reach on air ex pad with BUE cone touch (10 cones x4).   Pt response for medical necessity: Pt is able to perform all tasks but demonstrates difficulty maintaining balance without UE assist during single leg tasks. She experiences right knee pain with end range flexion during LAQ with ankle weights. States that pain is mild but present.       PT Long Term Goals - 01/12/16 1256      PT LONG TERM GOAL #1   Title Pt will score >23/80 on LEFS to promote improved functional mobility   Baseline 10/18: 13/80   Time 4   Period Weeks   Status New     PT LONG TERM GOAL #2   Title Pt will score >42 on BERG balance assessment to promote independent function and decrease fall risk   Baseline 10/18: 37/56   Time 4   Period Weeks   Status New     PT LONG TERM GOAL #3   Title Pt will  be able to tolerate >20 minutes of continued standing exercise in clinic to demonstrate improved endurance so she can assist with grandchildren caretaking duties   Baseline 10/18: pt can ambulate for <10 minutes with rollator before she experiences SOB and must sit   Time 4   Period Weeks   Status New     PT LONG TERM GOAL #4   Title Pt will improve her strength per MMT by 1/2 grade to promote independence with ADLs and improve tolerance of upright activity   Baseline 10/18: : MMT R/L LE hip flexion 3+/3+, knee ext 4+/4+, knee flexion 4/4, dorsiflexion 4+/4+, plantarflexion 4+/4+. Hip add/hip abd not tested formally; pt with mild-moderate weakness of hip abductors when tested in seated position   Time 4    Period Weeks   Status New           Plan - 01/27/16 1132    Clinical Impression Statement Pt with onset of generalized anterior R knee pain with end range knee flexion; she experiences similar discomfort on L knee and states that they are variable as to which knee is more painful with activity. Pt with heavier foot falls during no UE assist activities on RLE.    Rehab Potential Fair   PT Frequency 2x / week   PT Duration 4 weeks   PT Treatment/Interventions ADLs/Self Care Home Management;Aquatic Therapy;Biofeedback;Cryotherapy;Electrical Stimulation;Moist Heat;DME Instruction;Gait training;Stair training;Functional mobility training;Therapeutic activities;Therapeutic exercise;Balance training;Neuromuscular re-education;Patient/family education;Manual techniques   PT Next Visit Plan progress strength/balance program, assess UE strength   PT Home Exercise Plan see pt instructions for LE strengthening exercises.   Consulted and Agree with Plan of Care Patient      Patient will benefit from skilled therapeutic intervention in order to improve the following deficits and impairments:  Abnormal gait, Decreased activity tolerance, Decreased balance, Decreased coordination, Decreased endurance, Decreased knowledge of use of DME, Decreased mobility, Decreased strength, Difficulty walking, Impaired sensation, Impaired UE functional use, Impaired vision/preception, Improper body mechanics, Pain  Visit Diagnosis: Muscle weakness (generalized)  Difficulty in walking, not elsewhere classified  Abnormal posture     Problem List There are no active problems to display for this patient.  Pura Spice, PT, DPT # [phone] [name] SPT 01/27/2016, 12:10 PM  [facility] [address] [place], [place], [address] Phone: [phone]   Fax:  [phone]  Name: Sarah Wyatt MRN: [id number] Date of Birth: 09/20/1940

## 2016-01-31 ENCOUNTER — Encounter: Payer: Self-pay | Admitting: Physical Therapy

## 2016-01-31 ENCOUNTER — Ambulatory Visit: Payer: Medicare Other | Admitting: Physical Therapy

## 2016-01-31 DIAGNOSIS — M6281 Muscle weakness (generalized): Secondary | ICD-10-CM | POA: Diagnosis not present

## 2016-01-31 DIAGNOSIS — R293 Abnormal posture: Secondary | ICD-10-CM

## 2016-01-31 DIAGNOSIS — R262 Difficulty in walking, not elsewhere classified: Secondary | ICD-10-CM

## 2016-01-31 NOTE — Therapy (Signed)
[facility] [facility] [address] [place], [place], [address] Phone: [phone]   Fax:  [phone]  Physical Therapy Treatment  Patient Details  Name: Sarah Wyatt MRN: [id number] Date of Birth: 07/30/1940 Referring Provider: Sallee Lange, NP  Encounter Date: 01/31/2016      PT End of Session - 01/31/16 1753    Visit Number 6   Number of Visits 8   Date for PT Re-Evaluation 02/08/16   Authorization - Visit Number 6   Authorization - Number of Visits 10   PT Start Time [id number]   PT Stop Time 1434   PT Time Calculation (min) 53 min   Equipment Utilized During Treatment Gait belt   Activity Tolerance Patient tolerated treatment well;Patient limited by fatigue   Behavior During Therapy [facility] for tasks assessed/performed      Past Medical History:  Diagnosis Date  . Hyperlipidemia   . Hypertension   . Myelodysplastic disease ([place])   . Stroke [facility])     Past Surgical History:  Procedure Laterality Date  . SHOULDER SURGERY Left   . TUBAL LIGATION      There were no vitals filed for this visit.      Subjective Assessment - 01/31/16 1752    Subjective Pt states that she is feeling well with no new c/o. States that she has been trying to incorporate more standing exercise into her daily routine; reports kitchen duties like washing dishes and preparing dinner allow her to stand with UE support if needed.   Pertinent History CVA 11/10/14; hx of myelodysplastic disease and lymphoma. Pt received OT/PT/SLP in inpatient rehab with sizeable gains. Pt has been largely inactive for the last year and is seeking PT to improve fitness/endurance and balance.   Limitations Lifting;Standing;Walking;House hold activities   How long can you walk comfortably? <10 minutes   Patient Stated Goals pt would like to improve her physical fitness and endurance   Currently in Pain? No/denies     Objective:  Nu Step 10 Minutes Level 5 (warm up/no  charge)  Neuromuscular Re-ed: On agility ladder with SPT CGA: marching pattern with emphasis on increased step length 53ft x6 with blue line as visual cue, backwards walking with emphasis on increased step length 82ft x6. Lateral steps with emphasis on increased step length and weight shift 17ft x6. Lateral steps with anterior/posterior touch with L LE 12 ft x6, with anterior/posterior touch with RLE 50ft x6. In // bars with UE support: Step up on 3'' step with unilateral UE assist x10, UE assist prn (hand hover) x10. Tandem walk in // bars 77ft x6: pt with difficulty maintaining tandem walk due to foot placement with repeated cueing for heel to toe contact. Ambulation with no AD with quick turns x10 to assist with direction changes..   Pt response for medical necessity: Pt with no c/o of knee pain/generalized pain this session. She does well with >15 minutes of upright standing activity with no required seated rest break. Will benefit from exercise performed outside of // bars as pt has tendency to correct with UE assist but demonstrates improved reliance on LE for stability tasks.       PT Long Term Goals - 01/12/16 1256      PT LONG TERM GOAL #1   Title Pt will score >23/80 on LEFS to promote improved functional mobility   Baseline 10/18: 13/80   Time 4   Period Weeks   Status New  PT LONG TERM GOAL #2   Title Pt will score >42 on BERG balance assessment to promote independent function and decrease fall risk   Baseline 10/18: 37/56   Time 4   Period Weeks   Status New     PT LONG TERM GOAL #3   Title Pt will be able to tolerate >20 minutes of continued standing exercise in clinic to demonstrate improved endurance so she can assist with grandchildren caretaking duties   Baseline 10/18: pt can ambulate for <10 minutes with rollator before she experiences SOB and must sit   Time 4   Period Weeks   Status New     PT LONG TERM GOAL #4   Title Pt will improve her strength per MMT by  1/2 grade to promote independence with ADLs and improve tolerance of upright activity   Baseline 10/18: : MMT R/L LE hip flexion 3+/3+, knee ext 4+/4+, knee flexion 4/4, dorsiflexion 4+/4+, plantarflexion 4+/4+. Hip add/hip abd not tested formally; pt with mild-moderate weakness of hip abductors when tested in seated position   Time 4   Period Weeks   Status New            Plan - 01/31/16 1754    Clinical Impression Statement Pt performs majority of agility/coordination tasks outside of // bars this session; demonstrates good balance with all tasks. She experiences 3-4 instances of instability but is able to self correct without SPT assist. Pt with short step length and difficulty achieving SLS for >1 second during stepping tasks.    Rehab Potential Fair   PT Frequency 2x / week   PT Duration 4 weeks   PT Treatment/Interventions ADLs/Self Care Home Management;Aquatic Therapy;Biofeedback;Cryotherapy;Electrical Stimulation;Moist Heat;DME Instruction;Gait training;Stair training;Functional mobility training;Therapeutic activities;Therapeutic exercise;Balance training;Neuromuscular re-education;Patient/family education;Manual techniques   PT Next Visit Plan progress strength/balance program, assess UE strength   PT Home Exercise Plan see pt instructions for LE strengthening exercises.   Consulted and Agree with Plan of Care Patient      Patient will benefit from skilled therapeutic intervention in order to improve the following deficits and impairments:  Abnormal gait, Decreased activity tolerance, Decreased balance, Decreased coordination, Decreased endurance, Decreased knowledge of use of DME, Decreased mobility, Decreased strength, Difficulty walking, Impaired sensation, Impaired UE functional use, Impaired vision/preception, Improper body mechanics, Pain  Visit Diagnosis: Muscle weakness (generalized)  Difficulty in walking, not elsewhere classified  Abnormal posture     Problem  List There are no active problems to display for this patient.  Pura Spice, PT, DPT # [phone] Mickel Baas Ulisses Vondrak SPT 01/31/2016, 6:08 PM  [facility] [address] [place], [place], [address] Phone: [phone]   Fax:  [phone]  Name: Sarah Wyatt MRN: [id number] Date of Birth: Aug 01, 1940

## 2016-02-01 ENCOUNTER — Encounter: Payer: Medicare Other | Admitting: Physical Therapy

## 2016-02-02 ENCOUNTER — Ambulatory Visit: Payer: Medicare Other | Admitting: Physical Therapy

## 2016-02-02 ENCOUNTER — Encounter: Payer: Self-pay | Admitting: Physical Therapy

## 2016-02-02 DIAGNOSIS — M6281 Muscle weakness (generalized): Secondary | ICD-10-CM | POA: Diagnosis not present

## 2016-02-02 DIAGNOSIS — R293 Abnormal posture: Secondary | ICD-10-CM

## 2016-02-02 DIAGNOSIS — R262 Difficulty in walking, not elsewhere classified: Secondary | ICD-10-CM

## 2016-02-02 NOTE — Therapy (Signed)
[facility] [address] [place], [place], [address] Phone: [phone]   Fax:  [phone]  Physical Therapy Treatment  Patient Details  Name: Sarah Wyatt MRN: [id number] Date of Birth: 17-May-1940 Referring Provider: Sallee Lange, NP  Encounter Date: 02/02/2016      PT End of Session - 02/02/16 1751    Visit Number 7   Number of Visits 8   Date for PT Re-Evaluation 02/08/16   Authorization - Visit Number 7   Authorization - Number of Visits 10   PT Start Time [id number]   PT Stop Time 1411   PT Time Calculation (min) 50 min   Equipment Utilized During Treatment Gait belt   Activity Tolerance Patient tolerated treatment well   Behavior During Therapy [facility] for tasks assessed/performed      Past Medical History:  Diagnosis Date  . Hyperlipidemia   . Hypertension   . Myelodysplastic disease ([place])   . Stroke [facility])     Past Surgical History:  Procedure Laterality Date  . SHOULDER SURGERY Left   . TUBAL LIGATION      There were no vitals filed for this visit.      Subjective Assessment - 02/02/16 1749    Subjective Pt states that she doesn't feel like she is having a good balance day; reports that some days she feels her legs are more cooperative than others. Reports that she doesn't always trust her left leg.    Pertinent History CVA 11/10/14; hx of myelodysplastic disease and lymphoma. Pt received OT/PT/SLP in inpatient rehab with sizeable gains. Pt has been largely inactive for the last year and is seeking PT to improve fitness/endurance and balance.   Limitations Lifting;Standing;Walking;House hold activities   How long can you walk comfortably? <10 minutes   Patient Stated Goals pt would like to improve her physical fitness and endurance   Currently in Pain? No/denies     Objective:  Nu Step 10 minutes Level 5 (cool down/no charge)  Neuromuscular Re-ed: SPT CGA: 3'' step taps R/L reciprocal (pt able to  achieve no UE support with RLE as stance limb; unable to perform with LLE as stance limb) x30. Partial lunge on 3'' step with anterior/posterior weight shift to facilitate good technique/increased confidence with step up x15 RLE, x15 LLE. Static balance with partial lunge set up x15 sec R/L x10. 3'' step up x30 (UE assist prn) pt unable to perform without assist this session due to LLE instability. Static balance on air ex pad <15 sec x8. Static balance with R/L weight shifting on air ex pad x10. Y balance pattern without cross body touch R/L x5. Ambulation in // bars with no UE assist 95ft x5, lateral steps in // bars with no UE assist 38ft x5.   Pt response for medical necessity: Pt presents with increased difficulty with LLE stance this session. She has difficulty with static balance tasks on unstable surface and with partial tandem stance. Pt states that she occasionally experiences decreased stability of LLE with no consistent reasoning.       PT Long Term Goals - 01/12/16 1256      PT LONG TERM GOAL #1   Title Pt will score >23/80 on LEFS to promote improved functional mobility   Baseline 10/18: 13/80   Time 4   Period Weeks   Status New     PT LONG TERM GOAL #2   Title Pt will score >42 on BERG balance assessment to  promote independent function and decrease fall risk   Baseline 10/18: 37/56   Time 4   Period Weeks   Status New     PT LONG TERM GOAL #3   Title Pt will be able to tolerate >20 minutes of continued standing exercise in clinic to demonstrate improved endurance so she can assist with grandchildren caretaking duties   Baseline 10/18: pt can ambulate for <10 minutes with rollator before she experiences SOB and must sit   Time 4   Period Weeks   Status New     PT LONG TERM GOAL #4   Title Pt will improve her strength per MMT by 1/2 grade to promote independence with ADLs and improve tolerance of upright activity   Baseline 10/18: : MMT R/L LE hip flexion 3+/3+, knee ext  4+/4+, knee flexion 4/4, dorsiflexion 4+/4+, plantarflexion 4+/4+. Hip add/hip abd not tested formally; pt with mild-moderate weakness of hip abductors when tested in seated position   Time 4   Period Weeks   Status New            Plan - 02/02/16 1751    Clinical Impression Statement Pt demonstrates decreased stability with LLE stance. She requires several minutes of practice when performing new tasks to become comfortable without UE assist, but demonstrates ability for 3'' step taps/step ups with RLE as stance leg. Pt unable to perform exercise with LLE as stance leg without UE assist this session. Pt with difficulty maintaining static balance with semi tandem and on air ex pad for >10 sec.     Rehab Potential Fair   PT Frequency 2x / week   PT Duration 4 weeks   PT Treatment/Interventions ADLs/Self Care Home Management;Aquatic Therapy;Biofeedback;Cryotherapy;Electrical Stimulation;Moist Heat;DME Instruction;Gait training;Stair training;Functional mobility training;Therapeutic activities;Therapeutic exercise;Balance training;Neuromuscular re-education;Patient/family education;Manual techniques   PT Next Visit Plan progress strength/balance program.    PT Home Exercise Plan see pt instructions for LE strengthening exercises.   Consulted and Agree with Plan of Care Patient      Patient will benefit from skilled therapeutic intervention in order to improve the following deficits and impairments:  Abnormal gait, Decreased activity tolerance, Decreased balance, Decreased coordination, Decreased endurance, Decreased knowledge of use of DME, Decreased mobility, Decreased strength, Difficulty walking, Impaired sensation, Impaired UE functional use, Impaired vision/preception, Improper body mechanics, Pain  Visit Diagnosis: Muscle weakness (generalized)  Difficulty in walking, not elsewhere classified  Abnormal posture     Problem List There are no active problems to display for this  patient.  Pura Spice, PT, DPT # [phone] Mickel Baas Jayshaun Phillips SPT 02/02/2016, 6:01 PM  [place] [facility] [address] [place], [place], [address] Phone: [phone]   Fax:  [phone]  Name: Sarah Wyatt MRN: [id number] Date of Birth: [birth year]

## 2016-02-03 ENCOUNTER — Encounter: Payer: Medicare Other | Admitting: Physical Therapy

## 2016-02-08 ENCOUNTER — Encounter: Payer: Medicare Other | Admitting: Physical Therapy

## 2016-02-10 ENCOUNTER — Encounter: Payer: Self-pay | Admitting: Physical Therapy

## 2016-02-10 ENCOUNTER — Ambulatory Visit: Payer: Medicare Other | Admitting: Physical Therapy

## 2016-02-10 DIAGNOSIS — M6281 Muscle weakness (generalized): Secondary | ICD-10-CM | POA: Diagnosis not present

## 2016-02-10 DIAGNOSIS — R531 Weakness: Secondary | ICD-10-CM

## 2016-02-10 DIAGNOSIS — R293 Abnormal posture: Secondary | ICD-10-CM

## 2016-02-10 DIAGNOSIS — R262 Difficulty in walking, not elsewhere classified: Secondary | ICD-10-CM

## 2016-02-10 NOTE — Therapy (Signed)
[facility] [address]. [place], [place], [address] Phone: [phone]   Fax:  [phone]  Physical Therapy Treatment  Patient Details  Name: Sarah Wyatt MRN: [id number] Date of Birth: 08/12/1940 Referring Provider: Sallee Lange, NP  Encounter Date: 02/10/2016      PT End of Session - 02/10/16 1223    Visit Number 8   Number of Visits 15   Date for PT Re-Evaluation 03/09/16   Authorization - Visit Number 8   Authorization - Number of Visits 17   PT Start Time 0921   PT Stop Time 1011   PT Time Calculation (min) 50 min   Equipment Utilized During Treatment Gait belt   Activity Tolerance Patient tolerated treatment well   Behavior During Therapy [facility] for tasks assessed/performed      Past Medical History:  Diagnosis Date  . Hyperlipidemia   . Hypertension   . Myelodysplastic disease ([place])   . Stroke [facility])     Past Surgical History:  Procedure Laterality Date  . SHOULDER SURGERY Left   . TUBAL LIGATION      There were no vitals filed for this visit.      Subjective Assessment - 02/10/16 0949    Subjective Pt states that she went to [facility] on Friday; was told that her cancer is still in remission. Pt states that she got sick over the weekend and was not able to come for physical therapy earlier this week. She is feeling better now.   Pertinent History CVA 11/10/14; hx of myelodysplastic disease and lymphoma. Pt received OT/PT/SLP in inpatient rehab with sizeable gains. Pt has been largely inactive for the last year and is seeking PT to improve fitness/endurance and balance.   Limitations Lifting;Standing;Walking;House hold activities   How long can you walk comfortably? <10 minutes   Patient Stated Goals pt would like to improve her physical fitness and endurance   Currently in Pain? No/denies     Objective:  Nu Step 10 minutes Level 5 (warm up/no charge)  Neuromuscular Re-ed: In // bars with SPT CGA:  Sit<>stand with no UE support. Static stance 2 minutes. Static stance with narrow base of support 1 minute. Weight shifting with lumbar rotation. Forward reach with feet shoulder width apart. 360 degree circle with no UE support. Alternating step touch with 6'' step x20; no UE assist. Pt requires multiple trials to perform without UE assist. Static tandem stance <10 sec holds x8 trials. Static single leg stance <4 sec x10 trials. Ambulation in // bars with no UE assist 18f x8. Lateral step in // bars with no UE assist 127fx8.   Pt response for medical necessity: Pt has demonstrated improvement in balance and overall functional mobility per BERG balance assessment and LEFS outcome measure but would benefit from continued skilled therapy to promote longterm success. She requires initial warm-up period prior to difficult/novel tasks before she can perform consistently. Benefits from use of UE assist initially for new tasks with progression to more difficulty. Pt unable to tolerate supine/sidelying position due to onset of moderate-severe back pain. Benefits from standing exercise program at this time to address deficits.       PT Long Term Goals - 02/10/16 1231      PT LONG TERM GOAL #1   Title Pt will score >23/80 on LEFS to promote improved functional mobility   Baseline 10/18: 13/80 11/16: 22/80   Time 4   Period Weeks   Status  Partially Met     PT LONG TERM GOAL #2   Title Pt will score >42 on BERG balance assessment to promote independent function and decrease fall risk   Baseline 10/18: 37/56 March 03, 2023: 43/56   Time 4   Period Weeks   Status Achieved     PT LONG TERM GOAL #3   Title Pt will be able to tolerate >20 minutes of continued standing exercise in clinic to demonstrate improved endurance so she can assist with grandchildren caretaking duties   Baseline 10/18: pt can ambulate for <10 minutes with rollator before she experiences SOB and must sit 03/03/2023: pt able to consistently tolerate  >10-15 minutes of continued standing exercise at low-moderate intensity   Time 4   Period Weeks   Status Partially Met     PT LONG TERM GOAL #4   Title Pt will improve her strength per MMT by 1/2 grade to promote independence with ADLs and improve tolerance of upright activity   Baseline 10/18: : MMT R/L LE hip flexion 3+/3+, knee ext 4+/4+, knee flexion 4/4, dorsiflexion 4+/4+, plantarflexion 4+/4+. Hip add/hip abd not tested formally; pt with mild-moderate weakness of hip abductors when tested in seated position   Time 4   Period Weeks   Status Partially Met     PT LONG TERM GOAL #5   Title Pt will score >48/56 on BERG balance assessment to promote increased safety with ambulation/standing tasks and decrease fall risk   Baseline 2023/03/03: 43/56   Time 4   Period Weeks   Status New            Plan - 2016-03-02 1228    Clinical Impression Statement Pt demonstrates overall improved exercise tolerance with ability to consistently perform >10 minutes of standing exercise within the last several PT sessions. She demonstrates progress with respect to BERG balance assessment with improved score of 43/56 and improved LEFS score to 22/80. Although demonstrative of consistent progress, these scores still indicate need for continued physical therapy services to decrease fall risk and ensure longterm functional mobility.   Rehab Potential Fair   PT Frequency 2x / week   PT Duration 4 weeks   PT Treatment/Interventions ADLs/Self Care Home Management;Aquatic Therapy;Biofeedback;Cryotherapy;Electrical Stimulation;Moist Heat;DME Instruction;Gait training;Stair training;Functional mobility training;Therapeutic activities;Therapeutic exercise;Balance training;Neuromuscular re-education;Patient/family education;Manual techniques   PT Next Visit Plan Progressive strength, balance, endurance program. Single leg/stepping tasks. Tandem/partial tandem stability. Dynamic balance.   PT Home Exercise Plan see pt  instructions for LE strengthening exercises.   Consulted and Agree with Plan of Care Patient      Patient will benefit from skilled therapeutic intervention in order to improve the following deficits and impairments:  Abnormal gait, Decreased activity tolerance, Decreased balance, Decreased coordination, Decreased endurance, Decreased knowledge of use of DME, Decreased mobility, Decreased strength, Difficulty walking, Impaired sensation, Impaired UE functional use, Impaired vision/preception, Improper body mechanics, Pain  Visit Diagnosis: Muscle weakness (generalized)  Difficulty in walking, not elsewhere classified  Abnormal posture  Weakness generalized       G-Codes - 2016/03/02 1338    Functional Assessment Tool Used Clinical impression/ Gait difficulty/ Muscle weakness/ Berg/ LEFS   Functional Limitation Mobility: Walking and moving around   Mobility: Walking and Moving Around Current Status (J4970) At least 40 percent but less than 60 percent impaired, limited or restricted   Mobility: Walking and Moving Around Goal Status (Y6378) At least 20 percent but less than 40 percent impaired, limited or restricted      Problem  List There are no active problems to display for this patient.  Pura Spice, PT, DPT # [phone] Derrill Memo, SPT 02/10/2016, 1:39 PM   [facility] [address] [place], [place], [address] Phone: [phone]   Fax:  [phone]  Name: Takyah Ciaramitaro MRN: [id number] Date of Birth: 1941/02/20

## 2016-02-15 ENCOUNTER — Ambulatory Visit: Payer: Medicare Other | Admitting: Physical Therapy

## 2016-02-21 ENCOUNTER — Ambulatory Visit: Payer: Medicare Other | Admitting: Physical Therapy

## 2016-02-21 DIAGNOSIS — M6281 Muscle weakness (generalized): Secondary | ICD-10-CM | POA: Diagnosis not present

## 2016-02-21 DIAGNOSIS — R293 Abnormal posture: Secondary | ICD-10-CM

## 2016-02-21 DIAGNOSIS — R262 Difficulty in walking, not elsewhere classified: Secondary | ICD-10-CM

## 2016-02-21 DIAGNOSIS — R2681 Unsteadiness on feet: Secondary | ICD-10-CM

## 2016-02-21 DIAGNOSIS — R278 Other lack of coordination: Secondary | ICD-10-CM

## 2016-02-21 DIAGNOSIS — R531 Weakness: Secondary | ICD-10-CM

## 2016-02-21 NOTE — Therapy (Signed)
[facility] [address]. [place], [place], [address] Phone: [phone]   Fax:  [phone]  Physical Therapy Treatment  Patient Details  Name: Sarah Wyatt MRN: [id number] Date of Birth: 1940/07/19 Referring Provider: Sallee Lange, NP  Encounter Date: 02/21/2016      PT End of Session - 02/22/16 0748    Visit Number 9   Number of Visits 15   Date for PT Re-Evaluation 03/09/16   Authorization - Visit Number 9   Authorization - Number of Visits 17   PT Start Time 0931   PT Stop Time 1027   PT Time Calculation (min) 56 min   Equipment Utilized During Treatment Gait belt   Activity Tolerance Patient tolerated treatment well   Behavior During Therapy [facility] for tasks assessed/performed      Past Medical History:  Diagnosis Date  . Hyperlipidemia   . Hypertension   . Myelodysplastic disease ([place])   . Stroke [facility])     Past Surgical History:  Procedure Laterality Date  . SHOULDER SURGERY Left   . TUBAL LIGATION      There were no vitals filed for this visit.      Subjective Assessment - 02/22/16 0747    Subjective Pt. states she is still fighting a cold but doing better.  Pt. was active over the Thanksgiving weekend with friends and family.  Pt. states she caught her toe on the rubber threshold while getting off transport lift and fell on R knee (no scraps/ bruising noted).  Pt. states she was able to pull up on seat in [place] and return to standing.     Pertinent History CVA 11/10/14; hx of myelodysplastic disease and lymphoma. Pt received OT/PT/SLP in inpatient rehab with sizeable gains. Pt has been largely inactive for the last year and is seeking PT to improve fitness/endurance and balance.   Limitations Lifting;Standing;Walking;House hold activities   How long can you walk comfortably? <10 minutes   Patient Stated Goals pt would like to improve her physical fitness and endurance   Currently in Pain? No/denies        Objective:  There.ex.: Nustep 15 minutes Level 6 B UE/LE (warm up/no charge).  Steps ups (forward/ lateral)- 10x2 each/ blue mat table sit to stands 10x2.    Neuromuscular Re-ed:  Walking in hallway with [place] working on hip flexion/ 2-point gait pattern 120 feet.  In // bars with CGA: Sit<>stand with no UE support. Static stance 2 minutes. Static stance with narrow base of support 1 minute. Weight shifting with lumbar rotation. Forward reach with feet shoulder width apart. 360 degree circle with no UE support. Alternating step touch with 6'' step x20; no UE assist.  Static tandem stance <10 sec holds x8 trials. Static single leg stance <4 sec x10 trials. Ambulation in // bars with no UE assist 78f x8. Lateral step in // bars with no UE assist 167fx8.  Sit to stand with 1 UE assist required for safety and stand to sit with no UE assist (improved control noted).    Pt response for medical necessity: Pt has demonstrated improvement in balance and overall functional mobility and will benefit from continued skilled therapy to promote longterm success. She requires initial warm-up period prior to difficult/novel tasks before she can perform consistently. Benefits from use of UE assist initially for new tasks with progression to more difficulty. Benefits from standing exercise program at this time to address deficits.  PT Long Term Goals - 02/10/16 1231      PT LONG TERM GOAL #1   Title Pt will score >23/80 on LEFS to promote improved functional mobility   Baseline 10/18: 13/80 11/16: 22/80   Time 4   Period Weeks   Status Partially Met     PT LONG TERM GOAL #2   Title Pt will score >42 on BERG balance assessment to promote independent function and decrease fall risk   Baseline 10/18: 37/56 11/16: 43/56   Time 4   Period Weeks   Status Achieved     PT LONG TERM GOAL #3   Title Pt will be able to tolerate >20 minutes of continued standing exercise in clinic to demonstrate  improved endurance so she can assist with grandchildren caretaking duties   Baseline 10/18: pt can ambulate for <10 minutes with rollator before she experiences SOB and must sit 11/16: pt able to consistently tolerate >10-15 minutes of continued standing exercise at low-moderate intensity   Time 4   Period Weeks   Status Partially Met     PT LONG TERM GOAL #4   Title Pt will improve her strength per MMT by 1/2 grade to promote independence with ADLs and improve tolerance of upright activity   Baseline 10/18: : MMT R/L LE hip flexion 3+/3+, knee ext 4+/4+, knee flexion 4/4, dorsiflexion 4+/4+, plantarflexion 4+/4+. Hip add/hip abd not tested formally; pt with mild-moderate weakness of hip abductors when tested in seated position   Time 4   Period Weeks   Status Partially Met     PT LONG TERM GOAL #5   Title Pt will score >48/56 on BERG balance assessment to promote increased safety with ambulation/standing tasks and decrease fall risk   Baseline 11/16: 43/56   Time 4   Period Weeks   Status New               Plan - 02/22/16 0749    Clinical Impression Statement Pt. remains hard working and motivated with ther.ex. program in PT and requires occaional seated rest breaks.  Pt. has a heavy cough several times during tx. session.  Moderate cuing provided for pt. to consistently increase hip/knee flexion during swing through phase of gait to prevent shuffling, esp. when changing floor surfaces/ thresholds.  No LOB during tx. session.     Rehab Potential Fair   PT Frequency 2x / week   PT Duration 4 weeks   PT Treatment/Interventions ADLs/Self Care Home Management;Aquatic Therapy;Biofeedback;Cryotherapy;Electrical Stimulation;Moist Heat;DME Instruction;Gait training;Stair training;Functional mobility training;Therapeutic activities;Therapeutic exercise;Balance training;Neuromuscular re-education;Patient/family education;Manual techniques   PT Next Visit Plan Progressive strength, balance,  endurance program. Single leg/stepping tasks. Tandem/partial tandem stability. Dynamic balance.   PT Home Exercise Plan see pt instructions for LE strengthening exercises.   Consulted and Agree with Plan of Care Patient      Patient will benefit from skilled therapeutic intervention in order to improve the following deficits and impairments:  Abnormal gait, Decreased activity tolerance, Decreased balance, Decreased coordination, Decreased endurance, Decreased knowledge of use of DME, Decreased mobility, Decreased strength, Difficulty walking, Impaired sensation, Impaired UE functional use, Impaired vision/preception, Improper body mechanics, Pain  Visit Diagnosis: Muscle weakness (generalized)  Difficulty in walking, not elsewhere classified  Abnormal posture  Weakness generalized  Muscular incoordination  Unsteadiness on feet     Problem List There are no active problems to display for this patient.  Pura Spice, PT, DPT # [id number] 02/22/2016, 7:54 AM  [place] [address] [place], [place], [address] Phone: [phone]   Fax:  [phone]  Name: Sarah Wyatt MRN: [id number] Date of Birth: 03-25-1941

## 2016-02-23 ENCOUNTER — Ambulatory Visit: Payer: Medicare Other | Admitting: Physical Therapy

## 2016-02-23 ENCOUNTER — Encounter: Payer: Self-pay | Admitting: Physical Therapy

## 2016-02-23 DIAGNOSIS — R2681 Unsteadiness on feet: Secondary | ICD-10-CM

## 2016-02-23 DIAGNOSIS — M6281 Muscle weakness (generalized): Secondary | ICD-10-CM

## 2016-02-23 DIAGNOSIS — R278 Other lack of coordination: Secondary | ICD-10-CM

## 2016-02-23 DIAGNOSIS — R262 Difficulty in walking, not elsewhere classified: Secondary | ICD-10-CM

## 2016-02-23 DIAGNOSIS — R531 Weakness: Secondary | ICD-10-CM

## 2016-02-23 DIAGNOSIS — R293 Abnormal posture: Secondary | ICD-10-CM

## 2016-02-23 NOTE — Therapy (Signed)
[facility] [address] [place], [place], [address] Phone: [phone]   Fax:  [phone]  Physical Therapy Treatment  Patient Details  Name: Sarah Wyatt MRN: [id number] Date of Birth: 07/07/1940 Referring Provider: Sallee Lange, NP  Encounter Date: 02/23/2016      PT End of Session - 02/23/16 0915    Visit Number 10   Number of Visits 15   Date for PT Re-Evaluation 03/09/16   Authorization - Visit Number 10   Authorization - Number of Visits 17   PT Start Time 0912   PT Stop Time 1010   PT Time Calculation (min) 58 min   Equipment Utilized During Treatment Gait belt   Activity Tolerance Patient tolerated treatment well   Behavior During Therapy [facility] for tasks assessed/performed      Past Medical History:  Diagnosis Date  . Hyperlipidemia   . Hypertension   . Myelodysplastic disease ([place])   . Stroke [facility])     Past Surgical History:  Procedure Laterality Date  . SHOULDER SURGERY Left   . TUBAL LIGATION      There were no vitals filed for this visit.      Subjective Assessment - 02/23/16 0914    Subjective Pt. reports no new complaints.  Pt. entered PT with use of rollator via [place] transportation.   Pertinent History CVA 11/10/14; hx of myelodysplastic disease and lymphoma. Pt received OT/PT/SLP in inpatient rehab with sizeable gains. Pt has been largely inactive for the last year and is seeking PT to improve fitness/endurance and balance.   Limitations Lifting;Standing;Walking;House hold activities   How long can you walk comfortably? <10 minutes   Patient Stated Goals pt would like to improve her physical fitness and endurance   Currently in Pain? No/denies      Objective:  There.ex.: Nustep 12+ minutes Level 6 B UE/LE (warm up/no charge).  Steps ups (forward/ lateral)- 10x2 each/ blue mat table sit to stands 10x2.  Discussed importance of standing there.ex. At home.   Neuromuscular Re-ed:  Functional reaching (cones)/ picking up cones from varying surfaces/ carrying 7# box from floor to waist in //-bars with no LOB or UE assist.  Walking in hallway with no assistive device working on foot clearance.  Seated/ walking in //-bars with no UE assist with alt. UE/LE touches (extra time/ focus required to complete tasks).  In // bars with CGA: Sit<>stand with no UE support. Static stance 2 minutes. Static stance with narrow base of support 1 minute. Weight shifting with lumbar rotation.  Alternating step touch with 6'' step x20; no UE assist.  Static tandem stance <10 sec holds x8 trials.  Turning CW/CCW with no UE assist on outside of //-bars.    Pt response for medical necessity: Pt has demonstrated improvement in balance and overall functional mobility and will benefit from continued skilled therapy to promote longterm success. She requires initial warm-up period prior to difficult/novel tasks before she can perform consistently. Benefits from occasional use of UE assist initially for new tasks with progression to more difficulty. Benefits from standing exercise program at this time to address deficits.       PT Long Term Goals - 02/10/16 1231      PT LONG TERM GOAL #1   Title Pt will score >23/80 on LEFS to promote improved functional mobility   Baseline 10/18: 13/80 11/16: 22/80   Time 4   Period Weeks   Status Partially Met  PT LONG TERM GOAL #2   Title Pt will score >42 on BERG balance assessment to promote independent function and decrease fall risk   Baseline 10/18: 37/56 11/16: 43/56   Time 4   Period Weeks   Status Achieved     PT LONG TERM GOAL #3   Title Pt will be able to tolerate >20 minutes of continued standing exercise in clinic to demonstrate improved endurance so she can assist with grandchildren caretaking duties   Baseline 10/18: pt can ambulate for <10 minutes with rollator before she experiences SOB and must sit 11/16: pt able to consistently tolerate  >10-15 minutes of continued standing exercise at low-moderate intensity   Time 4   Period Weeks   Status Partially Met     PT LONG TERM GOAL #4   Title Pt will improve her strength per MMT by 1/2 grade to promote independence with ADLs and improve tolerance of upright activity   Baseline 10/18: : MMT R/L LE hip flexion 3+/3+, knee ext 4+/4+, knee flexion 4/4, dorsiflexion 4+/4+, plantarflexion 4+/4+. Hip add/hip abd not tested formally; pt with mild-moderate weakness of hip abductors when tested in seated position   Time 4   Period Weeks   Status Partially Met     PT LONG TERM GOAL #5   Title Pt will score >48/56 on BERG balance assessment to promote increased safety with ambulation/standing tasks and decrease fall risk   Baseline 11/16: 43/56   Time 4   Period Weeks   Status New            Plan - 02/23/16 0916    Clinical Impression Statement Pt. required fewer rest breaks today and feeling better/ coughing less as compared to last tx. session.  No c/o pain throughout tx. session.  Pt. continues to require cuing to prevent shuffling gait pattern/ forward head posture with gait in clinic/ //-bars.  No LOB with dynamic balance tasks but pt. cautious and will occasionally require use of UE for safety.     Rehab Potential Fair   PT Frequency 2x / week   PT Duration 4 weeks   PT Treatment/Interventions ADLs/Self Care Home Management;Aquatic Therapy;Biofeedback;Cryotherapy;Electrical Stimulation;Moist Heat;DME Instruction;Gait training;Stair training;Functional mobility training;Therapeutic activities;Therapeutic exercise;Balance training;Neuromuscular re-education;Patient/family education;Manual techniques   PT Next Visit Plan Progressive strength, balance, endurance program. Single leg/stepping tasks. Tandem/partial tandem stability. Dynamic balance.   PT Home Exercise Plan see pt instructions for LE strengthening exercises.   Consulted and Agree with Plan of Care Patient       Patient will benefit from skilled therapeutic intervention in order to improve the following deficits and impairments:  Abnormal gait, Decreased activity tolerance, Decreased balance, Decreased coordination, Decreased endurance, Decreased knowledge of use of DME, Decreased mobility, Decreased strength, Difficulty walking, Impaired sensation, Impaired UE functional use, Impaired vision/preception, Improper body mechanics, Pain  Visit Diagnosis: Muscle weakness (generalized)  Difficulty in walking, not elsewhere classified  Abnormal posture  Weakness generalized  Muscular incoordination  Unsteadiness on feet     Problem List There are no active problems to display for this patient.  Pura Spice, PT, DPT # [id number] 02/24/2016, 7:42 AM  [facility] [address] [place], [place], [address] Phone: [phone]   Fax:  [phone]  Name: Sarah Wyatt MRN: [id number] Date of Birth: 11-Mar-1941

## 2016-02-28 ENCOUNTER — Encounter: Payer: Self-pay | Admitting: Physical Therapy

## 2016-02-28 ENCOUNTER — Ambulatory Visit: Payer: Medicare Other | Attending: Nurse Practitioner | Admitting: Physical Therapy

## 2016-02-28 DIAGNOSIS — M6281 Muscle weakness (generalized): Secondary | ICD-10-CM | POA: Diagnosis present

## 2016-02-28 DIAGNOSIS — R262 Difficulty in walking, not elsewhere classified: Secondary | ICD-10-CM | POA: Insufficient documentation

## 2016-02-28 DIAGNOSIS — R2681 Unsteadiness on feet: Secondary | ICD-10-CM | POA: Diagnosis present

## 2016-02-28 DIAGNOSIS — R278 Other lack of coordination: Secondary | ICD-10-CM | POA: Insufficient documentation

## 2016-02-28 DIAGNOSIS — R531 Weakness: Secondary | ICD-10-CM | POA: Diagnosis present

## 2016-02-28 DIAGNOSIS — R293 Abnormal posture: Secondary | ICD-10-CM | POA: Insufficient documentation

## 2016-02-28 NOTE — Therapy (Signed)
[facility] [address] [place], [place], [address] Phone: [phone]   Fax:  [phone]  Physical Therapy Treatment  Patient Details  Name: Sarah Wyatt MRN: [id number] Date of Birth: 1940/11/21 Referring Provider: Sallee Lange, NP  Encounter Date: 02/28/2016      PT End of Session - 02/28/16 1039    Visit Number 11   Number of Visits 15   Date for PT Re-Evaluation 03/09/16   Authorization - Visit Number 11   Authorization - Number of Visits 17   PT Start Time 0933   PT Stop Time 4174   PT Time Calculation (min) 66 min   Equipment Utilized During Treatment Gait belt   Activity Tolerance Patient tolerated treatment well;Patient limited by fatigue   Behavior During Therapy [facility] for tasks assessed/performed      Past Medical History:  Diagnosis Date  . Hyperlipidemia   . Hypertension   . Myelodysplastic disease ([place])   . Stroke [facility])     Past Surgical History:  Procedure Laterality Date  . SHOULDER SURGERY Left   . TUBAL LIGATION      There were no vitals filed for this visit.      Subjective Assessment - 02/28/16 1039    Subjective Pt. reports R hip tenderness but not pain limited at this time.  Pt. still has cold/cough but states it is getting better.    Pertinent History CVA 11/10/14; hx of myelodysplastic disease and lymphoma. Pt received OT/PT/SLP in inpatient rehab with sizeable gains. Pt has been largely inactive for the last year and is seeking PT to improve fitness/endurance and balance.   Limitations Lifting;Standing;Walking;House hold activities   How long can you walk comfortably? <10 minutes   Patient Stated Goals pt would like to improve her physical fitness and endurance   Currently in Pain? No/denies      Objective:  There.ex.: Nustep 68mnutes Level 6 B UE/LE(warm up/no charge). 2.5# standing hip flexion/ abd./ ext./ lateral walking (light to no UE touching), seated hip flexion/  abd./ heel raises/ LAQ 20x each (mirror feedback for posture correction/ consistency with movement patterns).    Neuromuscular Re-ed: Walking in hallway with no assistive device working on foot clearance.  Walking in //-bars with no UE assist with alt. UE/LE touches (extra time/ focus required to complete tasks and step pattern). In // bars with CGA: Sit<>stand with no UE support.   Alternating step touch with center of BOSU x20; no UE assist. Static tandem stance <10 sec holds x8 trials.    Pt response for medical necessity: Pt has demonstrated improvement in balance and overall functional mobility and willbenefit from continued skilled therapy to promote longterm success. She requires initial warm-up period prior to difficult/novel tasks before she can perform consistently. Benefits from occasional use of UE assist initially for new tasks with progression to more difficulty. Benefits from standing exercise program at this time to address deficits.  Less rest breaks required today with standing there.ex.       PT Long Term Goals - 02/10/16 1231      PT LONG TERM GOAL #1   Title Pt will score >23/80 on LEFS to promote improved functional mobility   Baseline 10/18: 13/80 11/16: 22/80   Time 4   Period Weeks   Status Partially Met     PT LONG TERM GOAL #2   Title Pt will score >42 on BERG balance assessment to promote independent function and decrease fall  risk   Baseline 10/18: 37/56 11/16: 43/56   Time 4   Period Weeks   Status Achieved     PT LONG TERM GOAL #3   Title Pt will be able to tolerate >20 minutes of continued standing exercise in clinic to demonstrate improved endurance so she can assist with grandchildren caretaking duties   Baseline 10/18: pt can ambulate for <10 minutes with rollator before she experiences SOB and must sit 11/16: pt able to consistently tolerate >10-15 minutes of continued standing exercise at low-moderate intensity   Time 4   Period Weeks   Status  Partially Met     PT LONG TERM GOAL #4   Title Pt will improve her strength per MMT by 1/2 grade to promote independence with ADLs and improve tolerance of upright activity   Baseline 10/18: : MMT R/L LE hip flexion 3+/3+, knee ext 4+/4+, knee flexion 4/4, dorsiflexion 4+/4+, plantarflexion 4+/4+. Hip add/hip abd not tested formally; pt with mild-moderate weakness of hip abductors when tested in seated position   Time 4   Period Weeks   Status Partially Met     PT LONG TERM GOAL #5   Title Pt will score >48/56 on BERG balance assessment to promote increased safety with ambulation/standing tasks and decrease fall risk   Baseline 11/16: 43/56   Time 4   Period Weeks   Status New               Plan - 02/28/16 1040    Clinical Impression Statement Pt. continues to compensate with decrease stance on R LE (due to hip soreness/discomfort/ weakness) with L hip ex. and step through phase of gait.  Pt. only required 2 short seated rest breaks during 40 min. of standing ther.ex. with no increase c/o pain.  Moderate cuing to increase consistent step pattern on L/R with walking, esp. without use of rollator.       Rehab Potential Fair   PT Frequency 2x / week   PT Duration 4 weeks   PT Treatment/Interventions ADLs/Self Care Home Management;Aquatic Therapy;Biofeedback;Cryotherapy;Electrical Stimulation;Moist Heat;DME Instruction;Gait training;Stair training;Functional mobility training;Therapeutic activities;Therapeutic exercise;Balance training;Neuromuscular re-education;Patient/family education;Manual techniques   PT Next Visit Plan Progressive strength, balance, endurance program. Single leg/stepping tasks. Tandem/partial tandem stability. Dynamic balance.   PT Home Exercise Plan see pt instructions for LE strengthening exercises.   Consulted and Agree with Plan of Care Patient      Patient will benefit from skilled therapeutic intervention in order to improve the following deficits and  impairments:  Abnormal gait, Decreased activity tolerance, Decreased balance, Decreased coordination, Decreased endurance, Decreased knowledge of use of DME, Decreased mobility, Decreased strength, Difficulty walking, Impaired sensation, Impaired UE functional use, Impaired vision/preception, Improper body mechanics, Pain  Visit Diagnosis: Muscle weakness (generalized)  Difficulty in walking, not elsewhere classified  Abnormal posture  Weakness generalized  Muscular incoordination     Problem List There are no active problems to display for this patient.  Pura Spice, PT, DPT # [phone] 02/28/2016, 10:53 AM  [facility] [address] [place], [place], [address] Phone: [phone]   Fax:  [phone]  Name: Sarah Wyatt MRN: [id number] Date of Birth: 09/24/1940

## 2016-03-01 ENCOUNTER — Ambulatory Visit: Payer: Medicare Other | Admitting: Physical Therapy

## 2016-03-06 ENCOUNTER — Ambulatory Visit: Payer: Medicare Other | Admitting: Physical Therapy

## 2016-03-06 DIAGNOSIS — M6281 Muscle weakness (generalized): Secondary | ICD-10-CM

## 2016-03-06 DIAGNOSIS — R262 Difficulty in walking, not elsewhere classified: Secondary | ICD-10-CM

## 2016-03-06 DIAGNOSIS — R293 Abnormal posture: Secondary | ICD-10-CM

## 2016-03-06 DIAGNOSIS — R2681 Unsteadiness on feet: Secondary | ICD-10-CM

## 2016-03-06 DIAGNOSIS — R278 Other lack of coordination: Secondary | ICD-10-CM

## 2016-03-07 NOTE — Therapy (Signed)
[facility] [address] [place], [place], [address] Phone: [phone]   Fax:  [phone]  Physical Therapy Treatment  Patient Details  Name: Sarah Wyatt MRN: [id number] Date of Birth: Feb 09, 1941 Referring Provider: Sallee Lange, NP  Encounter Date: 03/06/2016      PT End of Session - 03/07/16 1317    Visit Number 12   Number of Visits 15   Date for PT Re-Evaluation 03/09/16   Authorization - Visit Number 12   Authorization - Number of Visits 17   PT Start Time [id number]   PT Stop Time 1046   PT Time Calculation (min) 54 min   Equipment Utilized During Treatment Gait belt   Activity Tolerance Patient tolerated treatment well;Patient limited by fatigue   Behavior During Therapy [facility] for tasks assessed/performed      Past Medical History:  Diagnosis Date  . Hyperlipidemia   . Hypertension   . Myelodysplastic disease ([place])   . Stroke [facility])     Past Surgical History:  Procedure Laterality Date  . SHOULDER SURGERY Left   . TUBAL LIGATION      There were no vitals filed for this visit.      Subjective Assessment - 03/07/16 1316    Subjective Pt. entered PT with no new complaints.  No LOB or falls reported.  Pt. states she is coughing less.     Pertinent History CVA 11/10/14; hx of myelodysplastic disease and lymphoma. Pt received OT/PT/SLP in inpatient rehab with sizeable gains. Pt has been largely inactive for the last year and is seeking PT to improve fitness/endurance and balance.   Limitations Lifting;Standing;Walking;House hold activities   How long can you walk comfortably? <10 minutes   Patient Stated Goals pt would like to improve her physical fitness and endurance   Currently in Pain? No/denies      Objective:  There.ex.: Nustep 49mnutes Level 6 B UE/LE(warm up/no charge). Standing/seated hip flexion/ abd./ heel raises/ LAQ 20x each (mirror feedback for posture correction/ consistency with  movement patterns).  Partial lunges in //-bars with cuing for posture/ proper head position 10x L/R.      Neuromuscular Re-ed: Walking in hallway with no assistive device working on hip flexion/ step pattern/ foot clearance. Walking in //-bars with no UE assist with alt. UE/LE touches (extra time/ focus required to complete tasks and step pattern).In // bars with SBA to CGA: Sit<>stand with no UE support.  3" plinth step ups and overs with min. To no UE assist (CGA for safety/ verbal cuing).  Agility ladder for feedback with step pattern/ clearance.  Turning CW/CCW.      Pt response for medical necessity: Pt has demonstrated improvement in balance and overall functional mobility and willbenefit from continued skilled therapy to promote longterm success. She requires initial warm-up period prior to difficult/novel tasks before she can perform consistently. Benefits from occasional use of UE assist initially for new tasks with progression to more difficulty. Benefits from standing exercise program at this time to address deficits.  Less rest breaks required today with standing there.ex.  No LOB.  [place] - 02/10/16 1231      PT LONG TERM GOAL #1   Title Pt will score >23/80 on LEFS to promote improved functional mobility   Baseline 10/18: 13/80 11/16: 22/80   Time 4   Period Weeks   Status Partially Met  PT LONG TERM GOAL #2   Title Pt will score >42 on BERG balance assessment to promote independent function and decrease fall risk   Baseline 10/18: 37/56 11/16: 43/56   Time 4   Period Weeks   Status Achieved     PT LONG TERM GOAL #3   Title Pt will be able to tolerate >20 minutes of continued standing exercise in clinic to demonstrate improved endurance so she can assist with grandchildren caretaking duties   Baseline 10/18: pt can ambulate for <10 minutes with rollator before she experiences SOB and must sit 11/16: pt able to consistently  tolerate >10-15 minutes of continued standing exercise at low-moderate intensity   Time 4   Period Weeks   Status Partially Met     PT LONG TERM GOAL #4   Title Pt will improve her strength per MMT by 1/2 grade to promote independence with ADLs and improve tolerance of upright activity   Baseline 10/18: : MMT R/L LE hip flexion 3+/3+, knee ext 4+/4+, knee flexion 4/4, dorsiflexion 4+/4+, plantarflexion 4+/4+. Hip add/hip abd not tested formally; pt with mild-moderate weakness of hip abductors when tested in seated position   Time 4   Period Weeks   Status Partially Met     PT LONG TERM GOAL #5   Title Pt will score >48/56 on BERG balance assessment to promote increased safety with ambulation/standing tasks and decrease fall risk   Baseline 11/16: 43/56   Time 4   Period Weeks   Status New            Plan - 03/07/16 1318    Clinical Impression Statement Difficulty with consistent step pattern on agility ladder/ markers without assistive device.  Pt. ambulates with slow but steady gait pattern with decrease hip flexion/ step pattern.  Decrease heel strike during intial stance phase of gait.  PT focused on challenging pt. with stepping over 3" plinth to increase hip flexion/ stance with no UE assist.  Pt. fatigued but improved overall muscle endurance noted.     Rehab Potential Fair   PT Frequency 2x / week   PT Duration 4 weeks   PT Treatment/Interventions ADLs/Self Care Home Management;Aquatic Therapy;Biofeedback;Cryotherapy;Electrical Stimulation;Moist Heat;DME Instruction;Gait training;Stair training;Functional mobility training;Therapeutic activities;Therapeutic exercise;Balance training;Neuromuscular re-education;Patient/family education;Manual techniques   PT Next Visit Plan Progressive strength, balance, endurance program. Single leg/stepping tasks. Tandem/partial tandem stability. Dynamic balance.  RECERT/ CHECK GOALS NEXT TX SESSION.     PT Home Exercise Plan see pt  instructions for LE strengthening exercises.   Consulted and Agree with Plan of Care Patient      Patient will benefit from skilled therapeutic intervention in order to improve the following deficits and impairments:  Abnormal gait, Decreased activity tolerance, Decreased balance, Decreased coordination, Decreased endurance, Decreased knowledge of use of DME, Decreased mobility, Decreased strength, Difficulty walking, Impaired sensation, Impaired UE functional use, Impaired vision/preception, Improper body mechanics, Pain  Visit Diagnosis: Muscle weakness (generalized)  Difficulty in walking, not elsewhere classified  Abnormal posture  Muscular incoordination  Unsteadiness on feet     Problem List There are no active problems to display for this patient.  Pura Spice, PT, DPT # [phone] 03/07/2016, 1:23 PM  [facility] [address] [place], [place], [address] Phone: [phone]   Fax:  [phone]  Name: Sarah Wyatt MRN: [id number] Date of Birth: 02-21-1941

## 2016-03-08 ENCOUNTER — Encounter: Payer: Self-pay | Admitting: Physical Therapy

## 2016-03-08 ENCOUNTER — Ambulatory Visit: Payer: Medicare Other | Admitting: Physical Therapy

## 2016-03-08 DIAGNOSIS — R262 Difficulty in walking, not elsewhere classified: Secondary | ICD-10-CM

## 2016-03-08 DIAGNOSIS — R293 Abnormal posture: Secondary | ICD-10-CM

## 2016-03-08 DIAGNOSIS — M6281 Muscle weakness (generalized): Secondary | ICD-10-CM | POA: Diagnosis not present

## 2016-03-08 DIAGNOSIS — R278 Other lack of coordination: Secondary | ICD-10-CM

## 2016-03-09 NOTE — Therapy (Addendum)
[facility] [address] [place], [place], [address] Phone: [phone]   Fax:  [phone]  Physical Therapy Treatment  Patient Details  Name: Sarah Wyatt MRN: [id number] Date of Birth: 07-23-1940 Referring Provider: Sallee Lange, NP  Encounter Date: 03/08/2016  13 of 17.  Recert on 96/78/93 (next visit).    Past Medical History:  Diagnosis Date  . Hyperlipidemia   . Hypertension   . Myelodysplastic disease ([place])   . Stroke [facility])     Past Surgical History:  Procedure Laterality Date  . SHOULDER SURGERY Left   . TUBAL LIGATION      There were no vitals filed for this visit.   Pt. states she is doing alright but has a cold.  Pt. reports compliance with seated ex. program.     Objective:  There.ex.: Nustep 19mnutes Level 6 B UE/LE(warm up/no charge). Standing/seated hip flexion/ abd./ heel raises/ LAQ 20x each (mirror feedback for posture correction/ consistency with movement patterns).  Partial lunges in //-bars with cuing for posture/ proper head position 10x L/R.  Recip. Stair climbing with B UE assist (cuing for posture/ BOS).       Neuromuscular Re-ed: Walking in hallway with no assistive device working on hip flexion/ step pattern/ foot clearance. Walking in //-bars with no UE assist with alt. UE/LE touches (extra time/ focus required to complete tasks and step pattern).In // bars with SBA to CGA: Sit<>stand with no UE support. 3" plinth step ups and overs with min. To no UE assist (CGA for safety/ verbal cuing).  Static/dynamic balance tasks in //-bars ([employer]).       Pt response for medical necessity: pt. benefits from occasional use of UE assist initially for new tasks with progression to more difficulty. Benefits from standing exercise program at this time to address deficits.  Pt. Fatigued during session which may be a result of cold.  No LOB.  RECERT NEXT TX.    LEFS: 22 out of 80.  Pt.  requires a lot of cuing/ assist with heel strikes on 1st step without use of UE on //-bars.  Pt. requires B UE assist with all standing therex.  Pt. able to ambulate short distances in clinic without rollator but limited by shuffling/ forward gait pattern/ increase fall risk.  Pt. progressing well with LE strengthening ex. program but will benefit from continued PT 1-2x/week.  Will recert goals/ treatment next appointment.         PT Long Term Goals - 02/10/16 1231      PT LONG TERM GOAL #1   Title Pt will score >23/80 on LEFS to promote improved functional mobility   Baseline 10/18: 13/80 11/16: 22/80   Time 4   Period Weeks   Status Partially Met     PT LONG TERM GOAL #2   Title Pt will score >42 on BERG balance assessment to promote independent function and decrease fall risk   Baseline 10/18: 37/56 11/16: 43/56   Time 4   Period Weeks   Status Achieved     PT LONG TERM GOAL #3   Title Pt will be able to tolerate >20 minutes of continued standing exercise in clinic to demonstrate improved endurance so she can assist with grandchildren caretaking duties   Baseline 10/18: pt can ambulate for <10 minutes with rollator before she experiences SOB and must sit 11/16: pt able to consistently tolerate >10-15 minutes of continued standing exercise at low-moderate intensity  Time 4   Period Weeks   Status Partially Met     PT LONG TERM GOAL #4   Title Pt will improve her strength per MMT by 1/2 grade to promote independence with ADLs and improve tolerance of upright activity   Baseline 10/18: : MMT R/L LE hip flexion 3+/3+, knee ext 4+/4+, knee flexion 4/4, dorsiflexion 4+/4+, plantarflexion 4+/4+. Hip add/hip abd not tested formally; pt with mild-moderate weakness of hip abductors when tested in seated position   Time 4   Period Weeks   Status Partially Met     PT LONG TERM GOAL #5   Title Pt will score >48/56 on BERG balance assessment to promote increased safety with  ambulation/standing tasks and decrease fall risk   Baseline 11/16: 43/56   Time 4   Period Weeks   Status New        Patient will benefit from skilled therapeutic intervention in order to improve the following deficits and impairments:  Abnormal gait, Decreased activity tolerance, Decreased balance, Decreased coordination, Decreased endurance, Decreased knowledge of use of DME, Decreased mobility, Decreased strength, Difficulty walking, Impaired sensation, Impaired UE functional use, Impaired vision/preception, Improper body mechanics, Pain  Visit Diagnosis: Muscle weakness (generalized)  Difficulty in walking, not elsewhere classified  Abnormal posture  Muscular incoordination     Problem List There are no active problems to display for this patient.  Pura Spice, PT, DPT # [phone] 03/11/2016, 5:18 PM [facility] [address] [place], [place], [address] Phone: [phone]   Fax:  [phone]  Name: Sarah Wyatt MRN: [id number] Date of Birth: Aug 29, 1940

## 2016-03-15 ENCOUNTER — Ambulatory Visit: Payer: Medicare Other | Admitting: Physical Therapy

## 2016-03-22 ENCOUNTER — Ambulatory Visit: Payer: Medicare Other | Admitting: Physical Therapy

## 2016-03-22 DIAGNOSIS — R278 Other lack of coordination: Secondary | ICD-10-CM

## 2016-03-22 DIAGNOSIS — M6281 Muscle weakness (generalized): Secondary | ICD-10-CM

## 2016-03-22 DIAGNOSIS — R2681 Unsteadiness on feet: Secondary | ICD-10-CM

## 2016-03-22 DIAGNOSIS — R262 Difficulty in walking, not elsewhere classified: Secondary | ICD-10-CM

## 2016-03-22 DIAGNOSIS — R293 Abnormal posture: Secondary | ICD-10-CM

## 2016-03-23 NOTE — Therapy (Addendum)
[facility] [address] [place], [place], [address] Phone: [phone]   Fax:  [phone]  Physical Therapy Treatment  Patient Details  Name: Sarah Wyatt MRN: [id number] Date of Birth: 08-04-1940 Referring Provider: Sallee Lange, NP  Encounter Date: 03/22/2016    14 of 17 treatments    Past Medical History:  Diagnosis Date  . Hyperlipidemia   . Hypertension   . Myelodysplastic disease ([place])   . Stroke [facility])     Past Surgical History:  Procedure Laterality Date  . SHOULDER SURGERY Left   . TUBAL LIGATION      There were no vitals filed for this visit.    Pt. reports no new complaints and continues to complete HEP.  Minimal hip/knee stiffness reported.        Objective:  There.ex.: Nustep 43mnutes Level 6 B UE/LE(warm up/no charge). Standing/seated hip flexion/ abd./ heel raises/ LAQ 20x each (mirror feedback for posture correction/ consistency with movement patterns). Partial lunges in //-bars with cuing for posture/ proper head position 10x L/R.  Recip. Stair climbing with B UE assist (cuing for posture/ BOS).  Step ups forward/ lateral (UE assist). 7# box lifting tasks   Neuromuscular Re-ed: Walking in hallway with no assistive device working on hip flexion/ step pattern/foot clearance. Walking in //-bars with no UE assist with alt. UE/LE touches (extra time/ focus required to complete tasks and step pattern). Static/dynamic balance tasks in //-bars ([employer]).   Functional reaching tasks with cones.               Pt response for medical necessity: pt. benefits from occasional use of UE assist initially for new tasks with progression to more difficulty. Benefits from standing exercise program at this time to address deficits.       LEFS: 22 out of 80.  Pt. requires a lot of cuing/ assist with heel strikes on 1st step without use of UE on //-bars.  Pt. requires B UE assist with all standing  therex.  Pt. able to ambulate short distances in clinic without rollator but limited by shuffling/ forward gait pattern/ increase fall risk.  Pt. progressing well with LE strengthening ex. program but will benefit from continued PT 1x/week.           PT Long Term Goals - 03/23/16 1524      PT LONG TERM GOAL #1   Title Pt will score >23/80 on LEFS to promote improved functional mobility   Baseline 10/18: 13/80 11/16: 22/80, 12/27: 20/80   Time 4   Period Weeks   Status Partially Met     PT LONG TERM GOAL #2   Title Pt will score >42 on BERG balance assessment to promote independent function and decrease fall risk   Baseline 10/18: 37/56 11/16: 43/56   Time 4   Period Weeks   Status Achieved     PT LONG TERM GOAL #3   Title Pt will be able to tolerate >20 minutes of continued standing exercise in clinic to demonstrate improved endurance so she can assist with grandchildren caretaking duties   Baseline 10/18: pt can ambulate for <10 minutes with rollator before she experiences SOB and must sit 11/16: pt able to consistently tolerate >10-15 minutes of continued standing exercise at low-moderate intensity   Time 4   Period Weeks   Status Partially Met     PT LONG TERM GOAL #4   Title Pt will improve her strength per MMT  by 1/2 grade to promote independence with ADLs and improve tolerance of upright activity   Baseline MMT R/L LE hip flexion 3+/3+, knee ext 4+/4+, knee flexion 4/4, dorsiflexion 4+/4+, plantarflexion 4+/4+. Hip add/hip abd not tested formally; pt with mild-moderate weakness of hip abductors when tested in seated position   Time 4   Period Weeks   Status Partially Met     PT LONG TERM GOAL #5   Title Pt will score >48/56 on BERG balance assessment to promote increased safety with ambulation/standing tasks and decrease fall risk   Baseline 11/16: 43/56   Time 4   Period Weeks   Status On-going        Patient will benefit from skilled therapeutic intervention in  order to improve the following deficits and impairments:  Abnormal gait, Decreased activity tolerance, Decreased balance, Decreased coordination, Decreased endurance, Decreased knowledge of use of DME, Decreased mobility, Decreased strength, Difficulty walking, Impaired sensation, Impaired UE functional use, Impaired vision/preception, Improper body mechanics, Pain  Visit Diagnosis: Muscle weakness (generalized)  Difficulty in walking, not elsewhere classified  Abnormal posture  Muscular incoordination  Unsteadiness on feet     Problem List There are no active problems to display for this patient.  Pura Spice, PT, DPT # [id number] 03/23/2016, 12:18 PM  [facility] [address] [place], [place], [address] Phone: [phone]   Fax:  [phone]  Name: Sarah Wyatt MRN: [id number] Date of Birth: 1941-03-06

## 2016-03-29 ENCOUNTER — Ambulatory Visit: Payer: Medicare Other | Attending: Nurse Practitioner | Admitting: Physical Therapy

## 2016-03-29 DIAGNOSIS — R278 Other lack of coordination: Secondary | ICD-10-CM | POA: Insufficient documentation

## 2016-03-29 DIAGNOSIS — M6281 Muscle weakness (generalized): Secondary | ICD-10-CM | POA: Insufficient documentation

## 2016-03-29 DIAGNOSIS — R2681 Unsteadiness on feet: Secondary | ICD-10-CM

## 2016-03-29 DIAGNOSIS — R262 Difficulty in walking, not elsewhere classified: Secondary | ICD-10-CM | POA: Insufficient documentation

## 2016-03-29 DIAGNOSIS — R293 Abnormal posture: Secondary | ICD-10-CM | POA: Diagnosis present

## 2016-03-30 NOTE — Therapy (Addendum)
[facility] [address]. [place], [place], [address] Phone: [phone]   Fax:  [phone]  Physical Therapy Treatment  Patient Details  Name: Sarah Wyatt MRN: [id number] Date of Birth: [birth year] Referring Provider: Sallee Lange, NP  Encounter Date: 03/29/2016  15 out of 17    Past Medical History:  Diagnosis Date  . Hyperlipidemia   . Hypertension   . Myelodysplastic disease ([place])   . Stroke [facility])     Past Surgical History:  Procedure Laterality Date  . SHOULDER SURGERY Left   . TUBAL LIGATION      There were no vitals filed for this visit.      Subjective Assessment - 03/30/16 1826    Pertinent History CVA 11/10/14; hx of myelodysplastic disease and lymphoma. Pt received OT/PT/SLP in inpatient rehab with sizeable gains. Pt has been largely inactive for the last year and is seeking PT to improve fitness/endurance and balance.   Limitations Lifting;Standing;Walking;House hold activities   How long can you walk comfortably? <10 minutes   Patient Stated Goals pt would like to improve her physical fitness and endurance   Currently in Pain? No/denies        No new complaints.  No falls reported.        Objective:  There.ex.: Nustep 37mnutes Level 6 B UE/LE(warm up/no charge). Standing/seated hip flexion/ abd./ heel raises/ LAQ 20x each (mirror feedback for posture correction/ consistency with movement patterns). Recip. Stair climbing with B UE assist (cuing for posture/ BOS). Step ups forward/ lateral (UE assist).  Supine ex. Program (reviewed handouts).  Neuromuscular Re-ed: Walking in hallway with no assistive device working on hip flexion/ step pattern/foot clearance. Walking in //-bars with no UE assist with alt. UE/LE touches (extra time/ focus required to complete tasks and step pattern).Static/dynamic balance tasks in //-bars (Airex). Forward/lateral walking and step touches.      Pt response for medical necessity: pt. benefits from occasional use of UE assist initially for new tasks with progression to more difficulty. Benefits from standing exercise program at this time to address deficits.      No changes to HEP at this time.  Pt. doing well with walking program and increase standing tolerance during ther.ex.  Pt. benefits from CGA with dynamic balance tasks.  No LOB but extra time required and occasional use of B UE assist for safety.  Probable discharge from PT next visit.        PT Long Term Goals - 02/10/16 1231      PT LONG TERM GOAL #1   Title Pt will score >23/80 on LEFS to promote improved functional mobility   Baseline 10/18: 13/80 11/16: 22/80   Time 4   Period Weeks   Status Partially Met     PT LONG TERM GOAL #2   Title Pt will score >42 on BERG balance assessment to promote independent function and decrease fall risk   Baseline 10/18: 37/56 11/16: 43/56   Time 4   Period Weeks   Status Achieved     PT LONG TERM GOAL #3   Title Pt will be able to tolerate >20 minutes of continued standing exercise in clinic to demonstrate improved endurance so she can assist with grandchildren caretaking duties   Baseline 10/18: pt can ambulate for <10 minutes with rollator before she experiences SOB and must sit 11/16: pt able to consistently tolerate >10-15 minutes of continued standing exercise at low-moderate intensity   Time 4  Period Weeks   Status Partially Met     PT LONG TERM GOAL #4   Title Pt will improve her strength per MMT by 1/2 grade to promote independence with ADLs and improve tolerance of upright activity   Baseline 10/18: : MMT R/L LE hip flexion 3+/3+, knee ext 4+/4+, knee flexion 4/4, dorsiflexion 4+/4+, plantarflexion 4+/4+. Hip add/hip abd not tested formally; pt with mild-moderate weakness of hip abductors when tested in seated position   Time 4   Period Weeks   Status Partially Met     PT LONG TERM GOAL #5    Title Pt will score >48/56 on BERG balance assessment to promote increased safety with ambulation/standing tasks and decrease fall risk   Baseline 11/16: 43/56   Time 4   Period Weeks   Status New               Plan - 03/30/16 1826    Rehab Potential Fair   PT Frequency 2x / week   PT Duration 4 weeks   PT Treatment/Interventions ADLs/Self Care Home Management;Aquatic Therapy;Biofeedback;Cryotherapy;Electrical Stimulation;Moist Heat;DME Instruction;Gait training;Stair training;Functional mobility training;Therapeutic activities;Therapeutic exercise;Balance training;Neuromuscular re-education;Patient/family education;Manual techniques      Patient will benefit from skilled therapeutic intervention in order to improve the following deficits and impairments:  Abnormal gait, Decreased activity tolerance, Decreased balance, Decreased coordination, Decreased endurance, Decreased knowledge of use of DME, Decreased mobility, Decreased strength, Difficulty walking, Impaired sensation, Impaired UE functional use, Impaired vision/preception, Improper body mechanics, Pain  Visit Diagnosis: Muscle weakness (generalized)  Difficulty in walking, not elsewhere classified  Abnormal posture  Muscular incoordination  Unsteadiness on feet     Problem List There are no active problems to display for this patient.  Michael C Sherk, PT, DPT # [id number] 03/30/2016, 6:27 PM  [facility] [address] [place], [place], [address] Phone: [phone]   Fax:  [phone]  Name: Sarah Wyatt MRN: [id number] Date of Birth: 01/29/1941    

## 2016-04-05 ENCOUNTER — Ambulatory Visit: Payer: Medicare Other | Admitting: Physical Therapy

## 2016-04-05 DIAGNOSIS — R278 Other lack of coordination: Secondary | ICD-10-CM

## 2016-04-05 DIAGNOSIS — M6281 Muscle weakness (generalized): Secondary | ICD-10-CM

## 2016-04-05 DIAGNOSIS — R293 Abnormal posture: Secondary | ICD-10-CM

## 2016-04-05 DIAGNOSIS — R2681 Unsteadiness on feet: Secondary | ICD-10-CM

## 2016-04-05 DIAGNOSIS — R262 Difficulty in walking, not elsewhere classified: Secondary | ICD-10-CM

## 2016-04-06 NOTE — Therapy (Addendum)
[facility] [address] [place], [place], [address] Phone: [phone]   Fax:  [phone]  Physical Therapy Treatment  Patient Details  Name: Sarah Wyatt MRN: [id number] Date of Birth: 1940-12-12 Referring Provider: Sallee Lange, NP  Encounter Date: 04/05/2016    16 out of 17   DISCHARGE SESSION   Past Medical History:  Diagnosis Date  . Hyperlipidemia   . Hypertension   . Myelodysplastic disease ([place])   . Stroke [facility])     Past Surgical History:  Procedure Laterality Date  . SHOULDER SURGERY Left   . TUBAL LIGATION      There were no vitals filed for this visit.    Pt. states she is doing well today and no reported pain except some stiffness in her knees. Pt. reports she has been doing well with HEP.       Objective:  There.ex.: Nustep 11mnutes Level 6 B UE/LE(warm up/no charge). Standing/seated hip flexion/ abd./ heel raises/ LAQ 20x each (mirror feedback for posture correction/ consistency with movement patterns). Recip. Stair climbing with B UE assist (cuing for posture/ BOS). Step ups forward/ lateral (UE assist).  Reviewed HEP in depth.  Pt. Instructed to contact PT if any issues or questions over next several weeks/months.    Neuromuscular Re-ed: Walking in hallway with no assistive device working on hip flexion/ step pattern/foot clearance. Walking in //-bars with no UE assist with alt. UE/LE touches (extra time/ focus required to complete tasks and step pattern).Static/dynamic balance tasks in //-bars (Airex). Forward/lateral walking and step touches.     Pt response for medical necessity: pt. benefits from occasional use of UE assist initially for new tasks with progression to more difficulty. Benefits from standing exercise program at this time to address deficits.        Discharge with focus on HEP/ daily walking ex. program.           PT Long Term Goals -  04/06/16 1555      PT LONG TERM GOAL #1   Title Pt will score >23/80 on LEFS to promote improved functional mobility   Baseline 10/18: 13/80 11/16: 22/80, 12/27: 20/80, 04/05/16: 25 out of 80   Time 4   Period Weeks   Status Achieved     PT LONG TERM GOAL #2   Title Pt will score >42 on BERG balance assessment to promote independent function and decrease fall risk   Baseline 10/18: 37/56 11/16: 43/56   Time 4   Period Weeks   Status Achieved     PT LONG TERM GOAL #3   Title Pt will be able to tolerate >20 minutes of continued standing exercise in clinic to demonstrate improved endurance so she can assist with grandchildren caretaking duties   Baseline 10/18: pt can ambulate for <10 minutes with rollator before she experiences SOB and must sit 11/16: pt able to consistently tolerate >10-15 minutes of continued standing exercise at low-moderate intensity   Period Weeks   Status Partially Met     PT LONG TERM GOAL #4   Title Pt will improve her strength per MMT by 1/2 grade to promote independence with ADLs and improve tolerance of upright activity     PT LONG TERM GOAL #5   Time 4   Period Weeks   Status Achieved         Patient will benefit from skilled therapeutic intervention in order to improve the following deficits and impairments:  Abnormal gait, Decreased activity tolerance, Decreased balance, Decreased coordination, Decreased endurance, Decreased knowledge of use of DME, Decreased mobility, Decreased strength, Difficulty walking, Impaired sensation, Impaired UE functional use, Impaired vision/preception, Improper body mechanics, Pain  Visit Diagnosis: Muscle weakness (generalized)  Difficulty in walking, not elsewhere classified  Abnormal posture  Muscular incoordination  Unsteadiness on feet     Problem List There are no active problems to display for this patient.  Pura Spice, PT, DPT # [phone] 04/06/2016, 3:57 PM  [facility] [address] [place], [place], [address] Phone: [phone]   Fax:  [phone]  Name: Sarah Wyatt MRN: [id number] Date of Birth: 1940/11/23

## 2016-04-16 IMAGING — US US EXTREM LOW VENOUS*L*
1 series · 13 of 24 positions shown · non-contrast
Comparison: None.

CLINICAL DATA: 74-year-old female with left calf pain and swelling



[Series 1: us extrem low venous*left* · 0.08mm/px · 13 of 40 slices shown]
[im 1/40]
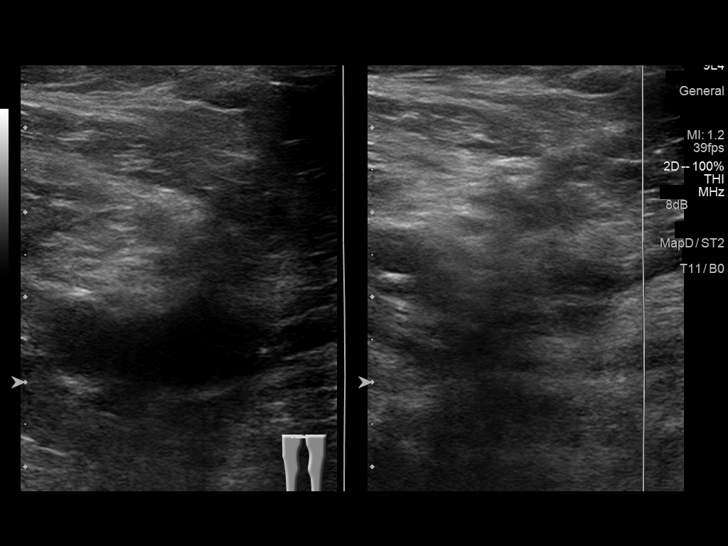
[im 4/40]
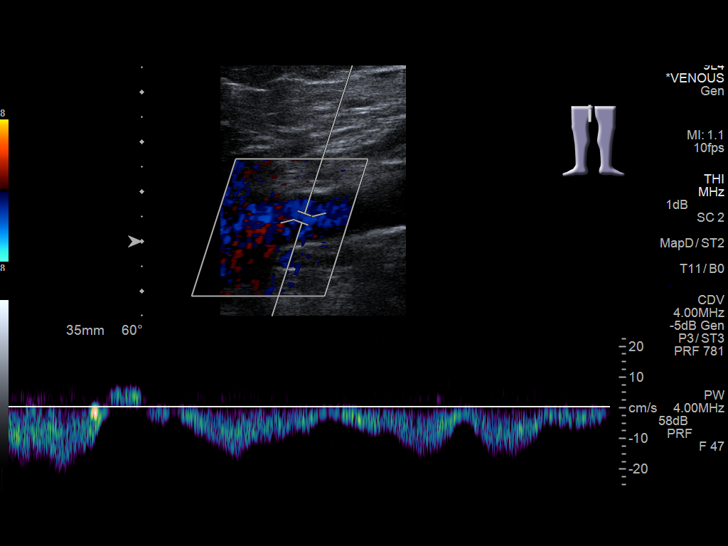
[im 7/40]
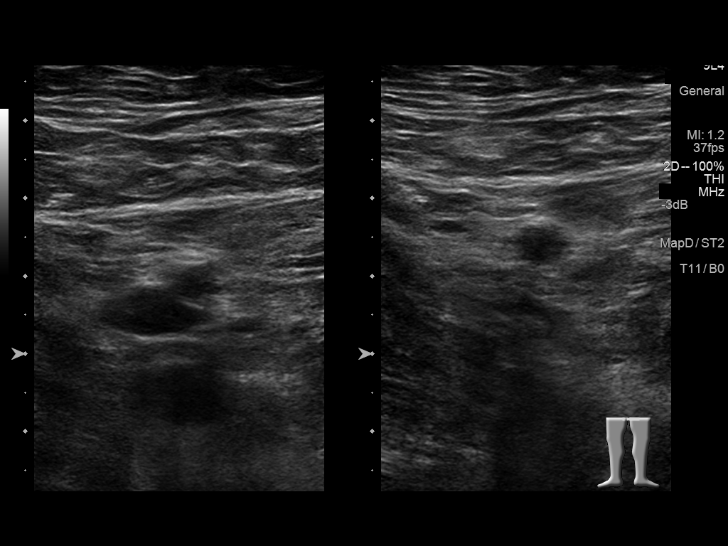
[im 11/40]
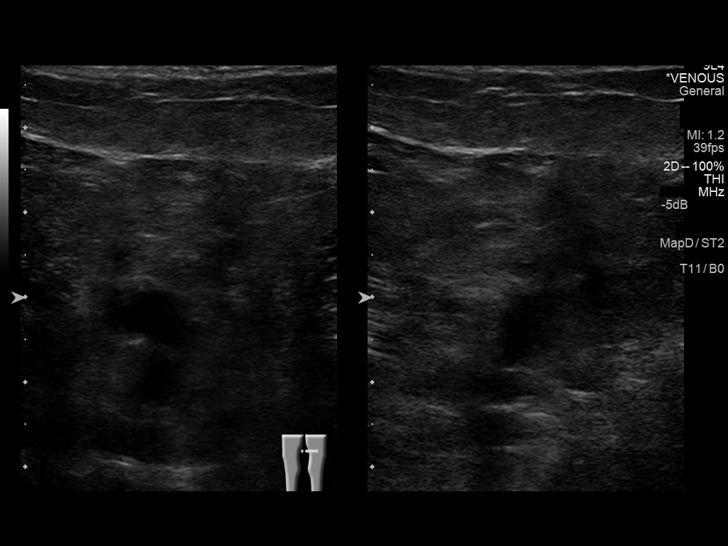
[im 14/40]
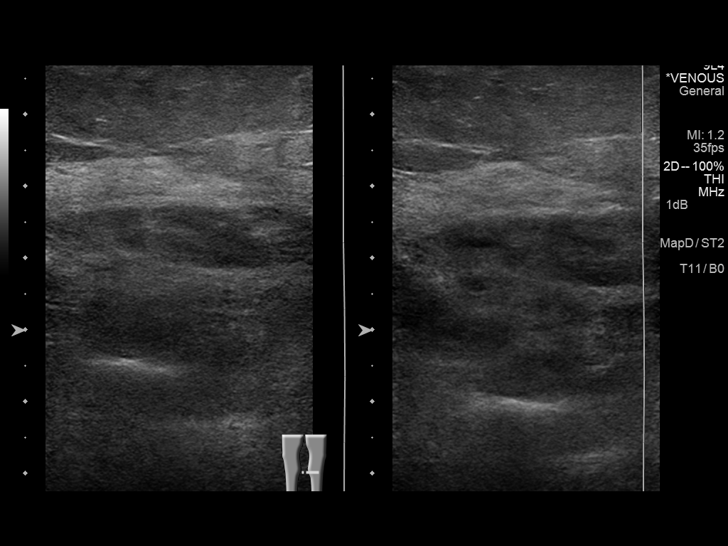
[im 17/40]
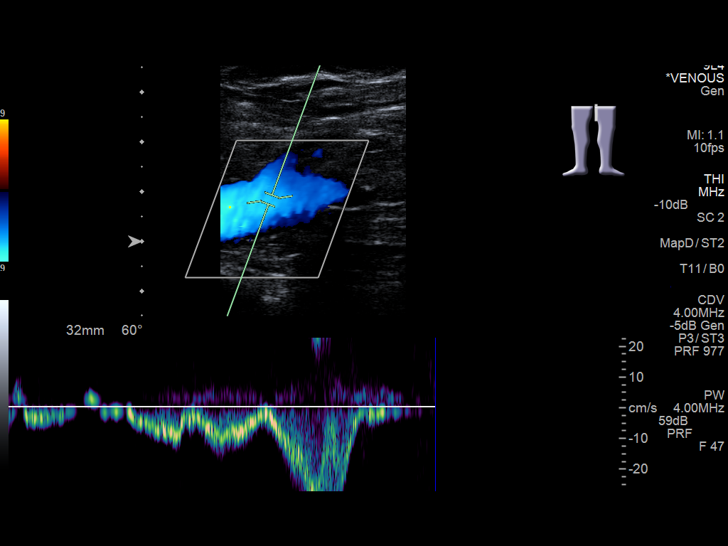
[im 21/40]
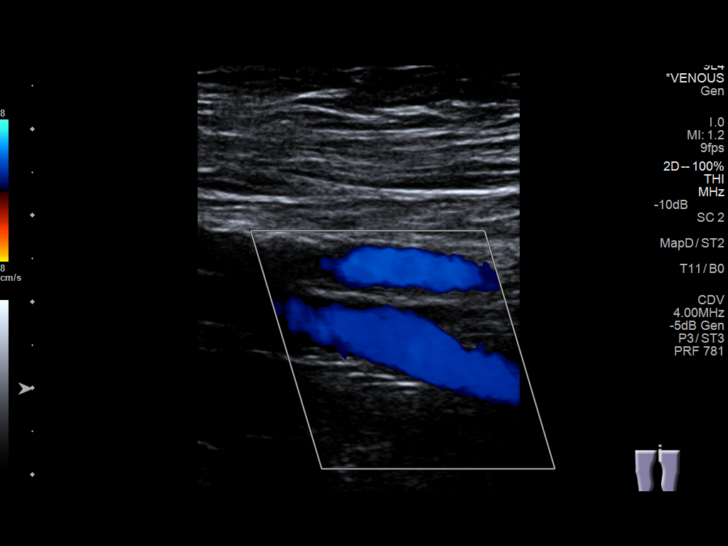
[im 23/40]
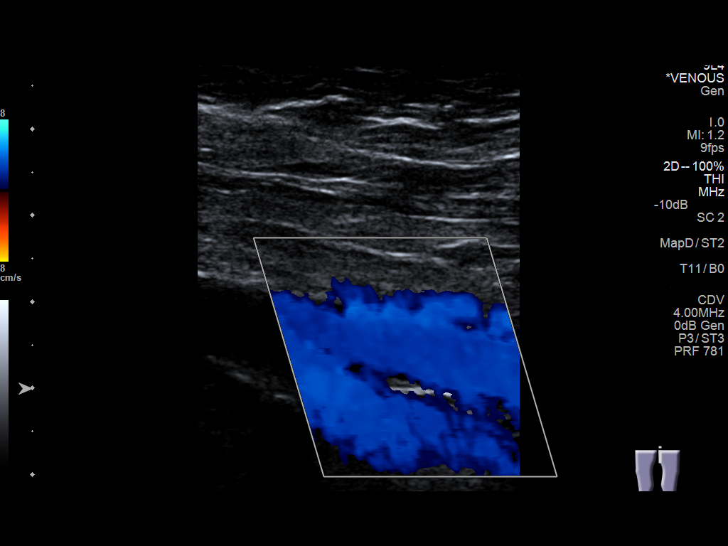
[im 26/40]
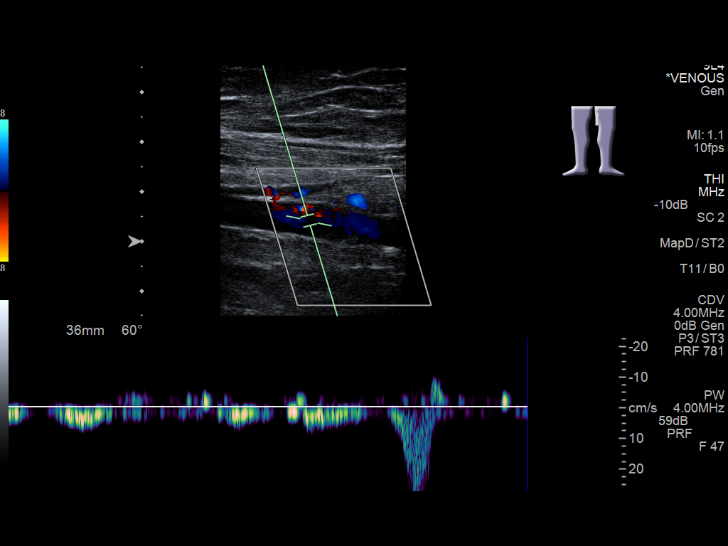
[im 29/40]
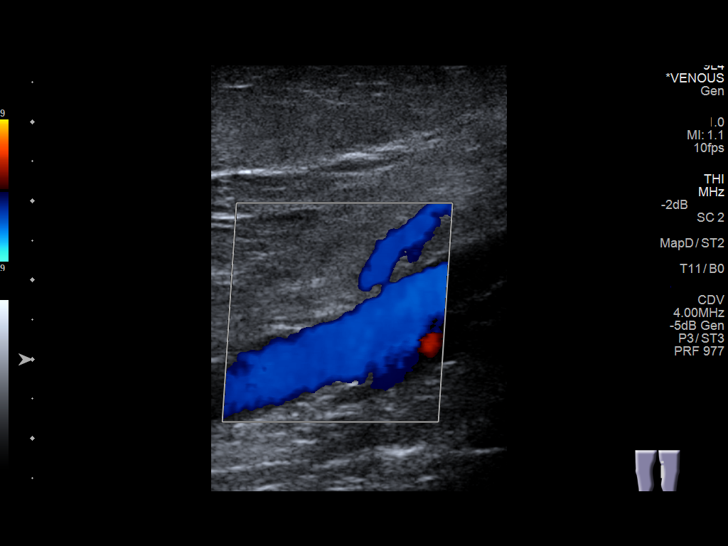
[im 33/40]
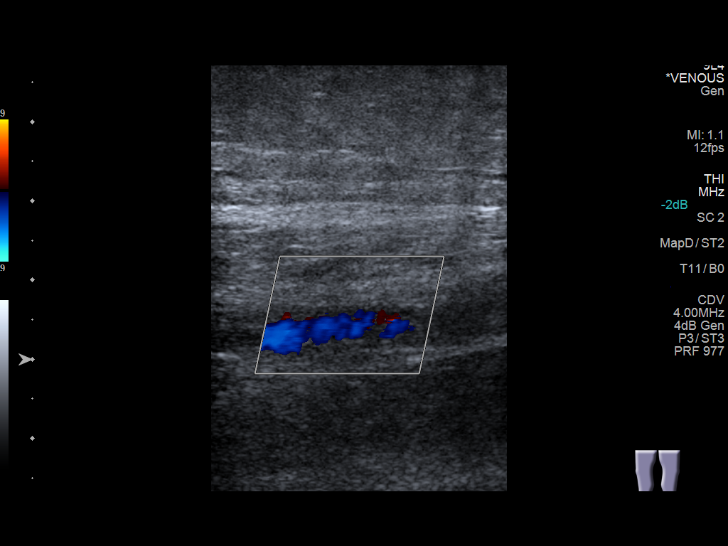
[im 36/40]
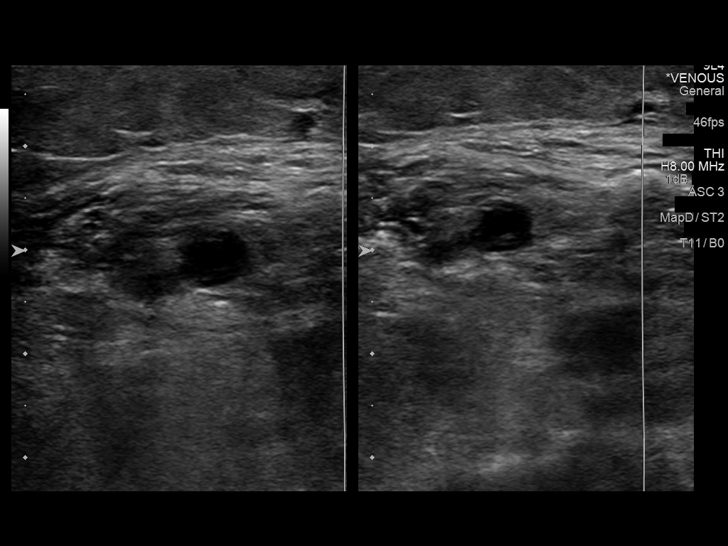
[im 40/40]
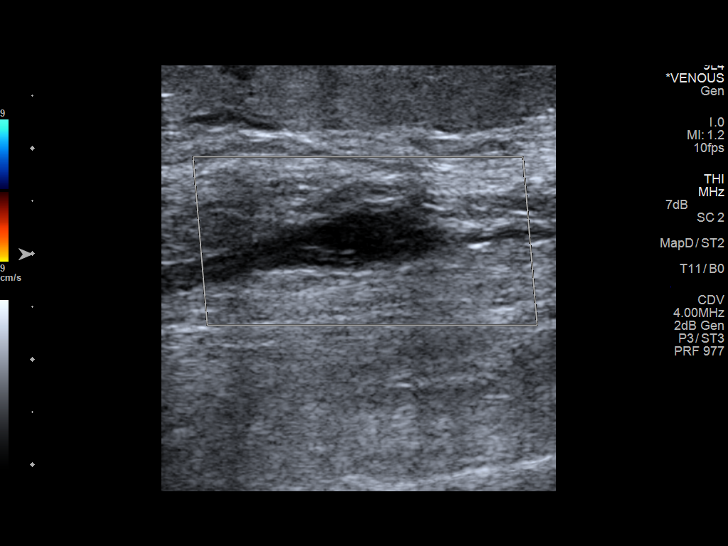

[13 of 24 positions shown; findings below may reference images not displayed]

FINDINGS: Contralateral Common Femoral Vein: Respiratory phasicity is normal
and symmetric with the symptomatic side. No evidence of thrombus.
Normal compressibility.

Common Femoral Vein: No evidence of thrombus. Normal
compressibility, respiratory phasicity and response to augmentation.

Saphenofemoral Junction: No evidence of thrombus. Normal
compressibility and flow on color Doppler imaging.

Profunda Femoral Vein: No evidence of thrombus. Normal
compressibility and flow on color Doppler imaging.

Femoral Vein: No evidence of thrombus. Normal compressibility,
respiratory phasicity and response to augmentation.

Popliteal Vein: No evidence of thrombus. Normal compressibility,
respiratory phasicity and response to augmentation.

Calf Veins: No evidence of thrombus within the posterior tibial or
peroneal veins. However, 1 of the gastrocnemius veins is
noncompressible and demonstrates low-level internal echoes
consistent with thrombus. No evidence of color flow on color Doppler
imaging. There is no extension into the popliteal system.

Superficial Great Saphenous Vein: No evidence of thrombus. Normal
compressibility and flow on color Doppler imaging.

Venous Reflux:  None.

Other Findings:  None.
IMPRESSION: 1. Positive for small isolated calf DVT involving 1 of the paired
gastrocnemius veins.
2. No evidence of extension into the popliteal vein or above the
knee.

## 2016-06-24 NOTE — Addendum Note (Signed)
Addended by: Pura Spice on: 06/24/2016 03:38 PM   Modules accepted: Orders

## 2016-09-01 ENCOUNTER — Emergency Department: Payer: Medicare Other

## 2016-09-01 ENCOUNTER — Encounter: Payer: Self-pay | Admitting: Emergency Medicine

## 2016-09-01 ENCOUNTER — Emergency Department
Admission: EM | Admit: 2016-09-01 | Discharge: 2016-09-01 | Disposition: A | Discharge status: Home / Self Care | Payer: Medicare Other | Attending: Emergency Medicine | Admitting: Emergency Medicine

## 2016-09-01 DIAGNOSIS — S0990XA Unspecified injury of head, initial encounter: Secondary | ICD-10-CM | POA: Diagnosis not present

## 2016-09-01 DIAGNOSIS — Z8673 Personal history of transient ischemic attack (TIA), and cerebral infarction without residual deficits: Secondary | ICD-10-CM | POA: Insufficient documentation

## 2016-09-01 DIAGNOSIS — W19XXXA Unspecified fall, initial encounter: Secondary | ICD-10-CM

## 2016-09-01 DIAGNOSIS — Y998 Other external cause status: Secondary | ICD-10-CM | POA: Insufficient documentation

## 2016-09-01 DIAGNOSIS — R531 Weakness: Secondary | ICD-10-CM | POA: Insufficient documentation

## 2016-09-01 DIAGNOSIS — W0110XA Fall on same level from slipping, tripping and stumbling with subsequent striking against unspecified object, initial encounter: Secondary | ICD-10-CM | POA: Diagnosis not present

## 2016-09-01 DIAGNOSIS — Z79899 Other long term (current) drug therapy: Secondary | ICD-10-CM | POA: Diagnosis not present

## 2016-09-01 DIAGNOSIS — Z87891 Personal history of nicotine dependence: Secondary | ICD-10-CM | POA: Diagnosis not present

## 2016-09-01 DIAGNOSIS — J449 Chronic obstructive pulmonary disease, unspecified: Secondary | ICD-10-CM | POA: Insufficient documentation

## 2016-09-01 DIAGNOSIS — C946 Myelodysplastic disease, not classified: Secondary | ICD-10-CM | POA: Insufficient documentation

## 2016-09-01 DIAGNOSIS — Y929 Unspecified place or not applicable: Secondary | ICD-10-CM | POA: Insufficient documentation

## 2016-09-01 DIAGNOSIS — I10 Essential (primary) hypertension: Secondary | ICD-10-CM | POA: Diagnosis not present

## 2016-09-01 DIAGNOSIS — Y9389 Activity, other specified: Secondary | ICD-10-CM | POA: Diagnosis not present

## 2016-09-01 HISTORY — DX: Chronic obstructive pulmonary disease, unspecified: J44.9

## 2016-09-01 LAB — URINALYSIS, COMPLETE (UACMP) WITH MICROSCOPIC
Bacteria, UA: NONE SEEN
Bilirubin Urine: NEGATIVE
GLUCOSE, UA: NEGATIVE mg/dL
HGB URINE DIPSTICK: NEGATIVE
KETONES UR: NEGATIVE mg/dL
LEUKOCYTES UA: NEGATIVE
Nitrite: NEGATIVE
PH: 5 (ref 5.0–8.0)
PROTEIN: NEGATIVE mg/dL
SQUAMOUS EPITHELIAL / LPF: NONE SEEN
Specific Gravity, Urine: 1.005 (ref 1.005–1.030)

## 2016-09-01 LAB — CBC WITH DIFFERENTIAL/PLATELET
BASOS ABS: 0 10*3/uL (ref 0–0.1)
BASOS PCT: 1 %
EOS ABS: 0.1 10*3/uL (ref 0–0.7)
EOS PCT: 3 %
HCT: 29 % — ABNORMAL LOW (ref 35.0–47.0)
HEMOGLOBIN: 9.8 g/dL — AB (ref 12.0–16.0)
LYMPHS ABS: 0.7 10*3/uL — AB (ref 1.0–3.6)
Lymphocytes Relative: 15 %
MCH: 28.6 pg (ref 26.0–34.0)
MCHC: 33.6 g/dL (ref 32.0–36.0)
MCV: 85.1 fL (ref 80.0–100.0)
Monocytes Absolute: 0.3 10*3/uL (ref 0.2–0.9)
Monocytes Relative: 7 %
NEUTROS PCT: 74 %
Neutro Abs: 3.7 10*3/uL (ref 1.4–6.5)
PLATELETS: 112 10*3/uL — AB (ref 150–440)
RBC: 3.41 MIL/uL — AB (ref 3.80–5.20)
RDW: 17.7 % — ABNORMAL HIGH (ref 11.5–14.5)
WBC: 5 10*3/uL (ref 3.6–11.0)

## 2016-09-01 LAB — BASIC METABOLIC PANEL
ANION GAP: 6 (ref 5–15)
BUN: 15 mg/dL (ref 6–20)
CHLORIDE: 108 mmol/L (ref 101–111)
CO2: 27 mmol/L (ref 22–32)
Calcium: 9 mg/dL (ref 8.9–10.3)
Creatinine, Ser: 0.72 mg/dL (ref 0.44–1.00)
Glucose, Bld: 121 mg/dL — ABNORMAL HIGH (ref 65–99)
Potassium: 3.8 mmol/L (ref 3.5–5.1)
SODIUM: 141 mmol/L (ref 135–145)

## 2016-09-01 LAB — TROPONIN I

## 2016-09-01 MED ORDER — SODIUM CHLORIDE 0.9 % IV BOLUS (SEPSIS)
500.0000 mL | Freq: Once | INTRAVENOUS | Status: AC
  Administered 2016-09-01: 500 mL via INTRAVENOUS

## 2016-09-01 NOTE — ED Provider Notes (Signed)
[facility] Emergency Department Provider Note  ____________________________________________   First MD Initiated Contact with Patient 09/01/16 [phone]     (approximate)  I have reviewed the triage vital signs and the nursing notes.   HISTORY  Chief Complaint Fall   HPI Sarah Wyatt is a 76 y.o. female with a history of myelodysplastic disease on chemotherapy who is presenting after an episode of weakness. She says that she just woken up after sleeping in a recliner overnight. She said that she stood up and had weakness in her knees. She did not feel lightheaded, she did not have any chest pain, palpitations or shortness of breath. However, she says that she fell backward and hit her head. There was no loss of consciousness. She only has a dull pain when she touches that her occiput at this time. She is denying any weakness at this time. Denies any nausea, vomiting or diarrhea. Said that she has been eating and drinking normally lately.   Past Medical History:  Diagnosis Date  . COPD (chronic obstructive pulmonary disease) ([place])   . Hyperlipidemia   . Hypertension   . Myelodysplastic disease ([place])   . Stroke [facility])     There are no active problems to display for this patient.   Past Surgical History:  Procedure Laterality Date  . SHOULDER SURGERY Left   . TUBAL LIGATION      Prior to Admission medications   Medication Sig Start Date End Date Taking? Authorizing Provider  atenolol (TENORMIN) 25 MG tablet Take 25 mg by mouth daily.    [provider]  atorvastatin (LIPITOR) 40 MG tablet Take 40 mg by mouth daily.    [provider]  brimonidine (ALPHAGAN) 0.2 % ophthalmic solution Place into both eyes 3 (three) times daily.    [provider]  cetirizine (ZYRTEC) 10 MG tablet Take 10 mg by mouth daily.    [provider]  dexamethasone (DECADRON) 4 MG tablet Take 8 mg by mouth 2 (two) times daily with a meal.     [provider]  docusate sodium (COLACE) 100 MG capsule Take 100 mg by mouth 2 (two) times daily.    [provider]  glucosamine-chondroitin 500-400 MG tablet Take 1 tablet by mouth 1 day or 1 dose.    [provider]  HYDROcodone-acetaminophen (NORCO/VICODIN) 5-325 MG tablet Take 0.5 tablets by mouth every 4 (four) hours as needed for moderate pain.    [provider]  losartan (COZAAR) 100 MG tablet Take 100 mg by mouth daily.    [provider]  mometasone (ASMANEX) 220 MCG/INH inhaler Inhale 1 puff into the lungs daily.    [provider]  montelukast (SINGULAIR) 10 MG tablet Take 10 mg by mouth at bedtime.    [provider]  senna (SENOKOT) 8.6 MG tablet Take 2 tablets by mouth daily.    [provider]  timolol (TIMOPTIC) 0.5 % ophthalmic solution Place 1 drop into both eyes 2 (two) times daily.    [provider]    Allergies Otho Darner allergy] and Sulfa antibiotics  History reviewed. No pertinent family history.  Social History Social History  Substance Use Topics  . Smoking status: Former Smoker    Quit date: 11/06/2014  . Smokeless tobacco: Never Used  . Alcohol use No    Review of Systems  Constitutional: No fever/chills Eyes: No visual changes. ENT: No sore throat. Cardiovascular: Denies chest pain. Respiratory: Denies shortness of breath.  Gastrointestinal: No abdominal pain.  No nausea, no vomiting.  No diarrhea.  No constipation. Genitourinary: Negative for dysuria. Musculoskeletal: Negative for back pain. Skin: Negative for rash. Neurological: Negative for headaches, focal weakness or numbness.   ____________________________________________   PHYSICAL EXAM:  VITAL SIGNS: ED Triage Vitals  Enc Vitals Group     BP      Pulse      Resp      Temp      Temp src      SpO2      Weight      Height      Head Circumference      Peak Flow      Pain Score      Pain Loc       Pain Edu?      Excl. in [place]?     Constitutional: Alert and oriented. Well appearing and in no acute distress. Eyes: Conjunctivae are normal.  Head: Atraumatic. No tenderness to palpation. No depression, hematoma or deformity. Nose: No congestion/rhinnorhea. Mouth/Throat: Mucous membranes are moist.  Neck: No stridor.   No tenderness to palpation to the midline cervical spine. Cardiovascular: Normal rate, regular rhythm. Grossly normal heart sounds.   Respiratory: Normal respiratory effort.  No retractions. Lungs CTAB. Gastrointestinal: Soft and nontender. No distention.  Musculoskeletal: No lower extremity tenderness nor edema.  No joint effusions. 5 out of 5 strength to bilateral lower extremities. Able to fully range the knees and hips without any restriction or pain. Neurologic:  Normal speech and language. No gross focal neurologic deficits are appreciated. Skin:  Skin is warm, dry and intact. No rash noted. Psychiatric: Mood and affect are normal. Speech and behavior are normal.  ____________________________________________   LABS (all labs ordered are listed, but only abnormal results are displayed)  Labs Reviewed  CBC WITH DIFFERENTIAL/PLATELET - Abnormal; Notable for the following:       Result Value   RBC 3.41 (*)    Hemoglobin 9.8 (*)    HCT 29.0 (*)    RDW 17.7 (*)    Platelets 112 (*)    Lymphs Abs 0.7 (*)    All other components within normal limits  BASIC METABOLIC PANEL - Abnormal; Notable for the following:    Glucose, Bld 121 (*)    All other components within normal limits  URINALYSIS, COMPLETE (UACMP) WITH MICROSCOPIC - Abnormal; Notable for the following:    Color, Urine YELLOW (*)    APPearance CLEAR (*)    All other components within normal limits  TROPONIN I   ____________________________________________  EKG  ED ECG REPORT I, Doran Stabler, the attending physician, personally viewed and interpreted this ECG.   Date: 09/01/2016  EKG  Time: 0746  Rate: 57  Rhythm: sinus bradycardia  Axis: normal  Intervals:none  ST&T Change: No ST segment elevation or depression. No abnormal T-wave inversion. EKG machine read the cardiogram as lateral ST depression but this is likely related to  ____________________________________________  RADIOLOGY  No acute finding on the CT of the brain ____________________________________________   PROCEDURES  Procedure(s) performed:   Procedures  Critical Care performed:   ____________________________________________   INITIAL IMPRESSION / ASSESSMENT AND PLAN / ED COURSE  Pertinent labs & imaging results that were available during my care of the patient were reviewed by me and considered in my medical decision making (see chart for details).  ----------------------------------------- 9:33 AM on 09/01/2016 -----------------------------------------  Patient is without any complaints at this time. Her  son is now the bedside and was concerned because of a fall and her history of a brain mass. She says now that she thinks she had just slipped off the end of her recliner and fall to the floor. She has been able to walk since the incident. Very reassuring vital signs at this time as well as lab work. Chronic anemia appears near baseline. Patient will be discharged home. I reviewed the testing results as well as the diagnosis with the patient and family and they're understanding and willing to comply with plan.      ____________________________________________   FINAL CLINICAL IMPRESSION(S) / ED DIAGNOSES  Fall. Weakness.    NEW MEDICATIONS STARTED DURING THIS VISIT:  New Prescriptions   No medications on file     Note:  This document was prepared using Dragon voice recognition software and may include unintentional dictation errors.     Orbie Pyo, MD 09/01/16 [phone]

## 2016-09-01 NOTE — ED Triage Notes (Signed)
Pt to ED by EMS from home after falling while attempting to stand from recliner. Pt denis LOC and pain. Pt currently taking oral chemo r/t to brain tumor.

## 2016-09-01 NOTE — ED Notes (Signed)
Pt verbalized understanding of discharge instructions. NAD at this time. 

## 2016-10-11 NOTE — Unmapped (Signed)
Specialty Pharmacy Refill Coordination Note     Carolyn Andersen is a 76 y.o. female contacted today regarding refills of her specialty medication(s).    Reviewed and verified with patient:      Specialty medication(s) and dose(s) confirmed: yes  Changes to medications: no  Changes to insurance: no    Medication Adherence    Patient reported X missed doses in the last month:  0  Specialty Medication:  Imbruvica 560mg   Informant:  patient  Reliability of informant:  fairly reliable  Provider-estimated medication adherence level:  90-100%  Confirmed plan for next specialty medication refill:  delivery by pharmacy  Medication Assistance Program  Refill Coordination  Has the Patient's Contact Information Changed:  No  Is the Shipping Address Different:  No  Shipping Information  Delivery Scheduled:  Yes  Delivery Date:  10/16/16  Medications to be Shipped:  Imbruvica 560mg           Follow-up: 4 week(s)     [employer]  Specialty Pharmacy Technician

## 2016-10-13 MED FILL — IMBRUVICA/560MG/TAB: IMBRUVICA/560MG/TAB | 28 days supply | Qty: 28 | Fill #2

## 2016-10-18 ENCOUNTER — Ambulatory Visit
Admission: RE | Admit: 2016-10-18 | Discharge: 2016-10-18 | Disposition: A | Discharge status: Home / Self Care | Payer: MEDICARE

## 2016-10-18 ENCOUNTER — Ambulatory Visit
Admission: RE | Admit: 2016-10-18 | Discharge: 2016-10-18 | Disposition: A | Discharge status: Home / Self Care | Admitting: Hematology & Oncology

## 2016-10-18 DIAGNOSIS — C8589 Other specified types of non-Hodgkin lymphoma, extranodal and solid organ sites: Principal | ICD-10-CM

## 2016-10-18 LAB — CBC W/ AUTO DIFF
BASOPHILS ABSOLUTE COUNT: 0 10*9/L (ref 0.0–0.1)
EOSINOPHILS ABSOLUTE COUNT: 0.2 10*9/L (ref 0.0–0.4)
HEMOGLOBIN: 10.2 g/dL — ABNORMAL LOW (ref 12.0–16.0)
LARGE UNSTAINED CELLS: 1 % (ref 0–4)
LYMPHOCYTES ABSOLUTE COUNT: 0.8 10*9/L — ABNORMAL LOW (ref 1.5–5.0)
MEAN CORPUSCULAR VOLUME: 84.8 fL (ref 80.0–100.0)
MEAN PLATELET VOLUME: 10.6 fL — ABNORMAL HIGH (ref 7.0–10.0)
MONOCYTES ABSOLUTE COUNT: 0.4 10*9/L (ref 0.2–0.8)
NEUTROPHILS ABSOLUTE COUNT: 4.8 10*9/L (ref 2.0–7.5)
PLATELET COUNT: 133 10*9/L — ABNORMAL LOW (ref 150–440)
RED BLOOD CELL COUNT: 3.5 10*12/L — ABNORMAL LOW (ref 4.00–5.20)
WBC ADJUSTED: 6.2 10*9/L (ref 4.5–11.0)

## 2016-10-18 LAB — COMPREHENSIVE METABOLIC PANEL
ALBUMIN: 4.3 g/dL (ref 3.5–5.0)
ALKALINE PHOSPHATASE: 43 U/L (ref 38–126)
ALT (SGPT): 34 U/L (ref 15–48)
ANION GAP: 11 mmol/L (ref 9–15)
AST (SGOT): 24 U/L (ref 14–38)
BILIRUBIN TOTAL: 2.2 mg/dL — ABNORMAL HIGH (ref 0.0–1.2)
BLOOD UREA NITROGEN: 14 mg/dL (ref 7–21)
CALCIUM: 9.4 mg/dL (ref 8.5–10.2)
CHLORIDE: 104 mmol/L (ref 98–107)
CO2: 29 mmol/L (ref 22.0–30.0)
CREATININE: 0.72 mg/dL (ref 0.60–1.00)
EGFR MDRD AF AMER: >=60 mL/min/{1.73_m2} (ref >=60)
GLUCOSE RANDOM: 100 mg/dL (ref 65–179)
POTASSIUM: 4.3 mmol/L (ref 3.5–5.0)
PROTEIN TOTAL: 7 g/dL (ref 6.5–8.3)
SODIUM: 144 mmol/L (ref 135–145)

## 2016-10-18 LAB — BLOOD UREA NITROGEN: Urea nitrogen:MCnc:Pt:Ser/Plas:Qn:: 14

## 2016-10-18 LAB — MEAN CORPUSCULAR HEMOGLOBIN: Lab: 29.2

## 2016-10-18 LAB — LACTATE DEHYDROGENASE: Lactate dehydrogenase:CCnc:Pt:Ser/Plas:Qn:: 494

## 2016-10-18 MED ORDER — IBRUTINIB 560 MG TABLET
ORAL_TABLET | Freq: Every day | ORAL | 2 refills | 0.00000 days | Status: CP
Start: 2016-10-18 — End: 2016-12-18

## 2016-10-18 NOTE — Unmapped (Signed)
Office Visit on 10/18/2016   Component Date Value Ref Range Status   ??? Sodium 10/18/2016 144  135 - 145 mmol/L Final   ??? Potassium 10/18/2016 4.3  3.5 - 5.0 mmol/L Final   ??? Chloride 10/18/2016 104  98 - 107 mmol/L Final   ??? CO2 10/18/2016 29.0  22.0 - 30.0 mmol/L Final   ??? BUN 10/18/2016 14  7 - 21 mg/dL Final   ??? Creatinine 10/18/2016 0.72  0.60 - 1.00 mg/dL Final   ??? BUN/Creatinine Ratio 10/18/2016 19   Final   ??? EGFR MDRD Non Af Amer 10/18/2016 >=60  >=60 mL/min/1.69m2 Final   ??? EGFR MDRD Af Amer 10/18/2016 >=60  >=60 mL/min/1.70m2 Final   ??? Anion Gap 10/18/2016 11  9 - 15 mmol/L Final   ??? Glucose 10/18/2016 100  65 - 179 mg/dL Final   ??? Calcium 16/12/9602 9.4  8.5 - 10.2 mg/dL Final   ??? Albumin 54/11/8117 4.3  3.5 - 5.0 g/dL Final   ??? Total Protein 10/18/2016 7.0  6.5 - 8.3 g/dL Final   ??? Total Bilirubin 10/18/2016 2.2* 0.0 - 1.2 mg/dL Final   ??? AST 14/78/2956 24  14 - 38 U/L Final   ??? ALT 10/18/2016 34  15 - 48 U/L Final   ??? Alkaline Phosphatase 10/18/2016 43  38 - 126 U/L Final   ??? LDH 10/18/2016 494  338 - 610 U/L Final   ??? WBC 10/18/2016 6.2  4.5 - 11.0 10*9/L Final   ??? RBC 10/18/2016 3.50* 4.00 - 5.20 10*12/L Final   ??? HGB 10/18/2016 10.2* 12.0 - 16.0 g/dL Final   ??? HCT 21/30/8657 29.6* 36.0 - 46.0 % Final   ??? MCV 10/18/2016 84.8  80.0 - 100.0 fL Final   ??? MCH 10/18/2016 29.2  26.0 - 34.0 pg Final   ??? MCHC 10/18/2016 34.5  31.0 - 37.0 g/dL Final   ??? RDW 84/69/6295 17.5* 12.0 - 15.0 % Final   ??? MPV 10/18/2016 10.6* 7.0 - 10.0 fL Final   ??? Platelet 10/18/2016 133* 150 - 440 10*9/L Final   ??? Variable HGB Concentration 10/18/2016 Slight* Not Present Final   ??? Absolute Neutrophils 10/18/2016 4.8  2.0 - 7.5 10*9/L Final   ??? Absolute Lymphocytes 10/18/2016 0.8* 1.5 - 5.0 10*9/L Final   ??? Absolute Monocytes 10/18/2016 0.4  0.2 - 0.8 10*9/L Final   ??? Absolute Eosinophils 10/18/2016 0.2  0.0 - 0.4 10*9/L Final   ??? Absolute Basophils 10/18/2016 0.0  0.0 - 0.1 10*9/L Final   ??? Large Unstained Cells 10/18/2016 1  0 - 4 % Final   ??? Microcytosis 10/18/2016 Slight* Not Present Final   ??? Anisocytosis 10/18/2016 Slight* Not Present Final   ??? Hypochromasia 10/18/2016 Slight* Not Present Final       Please call us if you experience:   1. Nausea or vomiting not controlled by nausea medicines  2. Fever of 100.5 F or higher   3. Uncontrolled pain  4. Any other concerning symptom     For health related questions call: Please call the office at [phone] and ask for Margaretmary Lombard, our nurse navigator.  For appointment changes call: Main Clinic [phone]  After hours call [place] Operator and ask for the Oncology Fellow on call: [phone]    Ivor Costa. Thera Flake, PA-C    Dr. Thayer Ohm Dittus  [facility] Hematology/Oncology

## 2016-10-18 NOTE — Unmapped (Signed)
Lab drawn and sent for analysis.

## 2016-10-18 NOTE — Unmapped (Signed)
IDENTIFICATION: This is a 76 y.o. 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 She is s/p WBRT and had a PR on MRI initially. Thereafter, her MRI showed further improvement and she has remained in a CR. She had been slowly improving her functional capacity, but at her 5/16 visit, she was more weak and slurring her speech more. 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 No chemotherapy 2/2 her baseline PS. Non-chemotherapy options for recurrent PCNSL include: ibrutinib CR: 42% (Choquet, ASH, 2016; Grommes, ASCO, 2017), revlimid CR: 30% (Ghesquieres ASH 2016), or nivolumab ORR: 100% (Nayak, Blood, 2017). Ibrutinib has been the most thoroughly studied with one study evaluating 52 patients. Nivolumab has excellent responses, but the study only evaluated 5 patients.     Decided to treat with ibrutinib and she has completed two months of therapy. MRI 09/15/16 shows stable disease. Her CBC is normal and she has no bleeding events.  Of note, patient is on a baby ASA, which we will continue in setting of PAD - this is not a contraindication for ibrutinib. Her course of dex has completed. Will have patient RTC monthly and repeat scan in 1 month.     Other Issues:  Poor PS: The patient is requesting a hospital bed. She has a home health aide that helps care for her while her son is at work.    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     Shelsy Seng K. Thera Flake, PA-C  Division of Hematology and Oncology  ------------------------------------------------------    INTERVAL HISTORY:  The patient feels her memory and speech is improving with time. She still has brief episode of confusion sometimes, but this is not even everyday. Her balance is still impaired so she uses a walker to ambulate. She reports the ibrutinib is going very well and she does not note any side effects. Denies B-symptoms or new focal deficits.    Oncology History    -- 11/15/14: presented with weakness, aphasia, blurry vision.  MRI showed a left temporoparietal mass that crossed the spine of the corpus callosum. This was suspicious for lymphoma. 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 PET/CT without any evidence of systemic lymphoma   -- 11/20/14: started WBRT.  Chemotherapy was deferred due to underlying MDS and age.  The total radiation dose will be 5040 cGy.   -- 01/27/15: post WBRT MRI brain shows interval decrease in the size and enhancement of left posterior parietal periventricular lesion.  The lesion measures approximately 0.7 x 1.8 x 0.8 cm (previously 2.8 x 4.2 x 2.8 cm).         Primary CNS lymphoma (CMS-HCC)    11/10/2014 Initial Diagnosis     Primary CNS lymphoma (RAF-HCC)          REVIEW OF SYSTEMS:  See HPI. A 10 system ROS is otherwise negative.     PHYSICAL EXAMINATION:  VITAL SIGNS:   Vitals:    10/18/16 0920   BP: 147/65   Pulse: 65  Resp: 18   Temp: 36.4 ??C (97.5 ??F)   TempSrc: Oral   SpO2: 98%   Weight: 91.2 kg (201 lb)     EXAM:  ECOG: 2  GENERAL: +slurred speech. Difficulty walking. AAOx3  HEENT: Pupils equal, round, and reactive to light.   NECK: Supple, no lymphadenopathy, no thyromegaly.  LUNGS: Clear to auscultation bilaterally.  HEART: Regular rate and rhythm. No R/G/M  ABDOMEN: Soft, nontender, nondistended.    EXTREMITIES: 1+ edema bilaterally   NEURO EXAM: Cranial nerves II-XII are intact. No focal deficits.  LYMPH NODES: No LAD    LABORATORY DATA:  Office Visit on 10/18/2016   Component Date Value Ref Range Status   ??? Sodium 10/18/2016 144  135 - 145 mmol/L Final   ??? Potassium 10/18/2016 4.3  3.5 - 5.0 mmol/L Final   ??? Chloride 10/18/2016 104  98 - 107 mmol/L Final   ??? CO2 10/18/2016 29.0  22.0 - 30.0 mmol/L Final   ??? BUN 10/18/2016 14  7 - 21 mg/dL Final   ??? Creatinine 10/18/2016 0.72  0.60 - 1.00 mg/dL Final   ??? BUN/Creatinine Ratio 10/18/2016 19   Final   ??? EGFR MDRD Non Af Amer 10/18/2016 >=60  >=60 mL/min/1.64m2 Final   ??? EGFR MDRD Af Amer 10/18/2016 >=60  >=60 mL/min/1.66m2 Final   ??? Anion Gap 10/18/2016 11  9 - 15 mmol/L Final   ??? Glucose 10/18/2016 100  65 - 179 mg/dL Final   ??? Calcium 95/62/1308 9.4  8.5 - 10.2 mg/dL Final   ??? Albumin 65/78/4696 4.3  3.5 - 5.0 g/dL Final   ??? Total Protein 10/18/2016 7.0  6.5 - 8.3 g/dL Final   ??? Total Bilirubin 10/18/2016 2.2* 0.0 - 1.2 mg/dL Final   ??? AST 29/52/8413 24  14 - 38 U/L Final   ??? ALT 10/18/2016 34  15 - 48 U/L Final   ??? Alkaline Phosphatase 10/18/2016 43  38 - 126 U/L Final   ??? LDH 10/18/2016 494  338 - 610 U/L Final   ??? WBC 10/18/2016 6.2  4.5 - 11.0 10*9/L Final   ??? RBC 10/18/2016 3.50* 4.00 - 5.20 10*12/L Final   ??? HGB 10/18/2016 10.2* 12.0 - 16.0 g/dL Final   ??? HCT 24/40/1027 29.6* 36.0 - 46.0 % Final   ??? MCV 10/18/2016 84.8  80.0 - 100.0 fL Final   ??? MCH 10/18/2016 29.2  26.0 - 34.0 pg Final   ??? MCHC 10/18/2016 34.5  31.0 - 37.0 g/dL Final   ??? RDW 25/36/6440 17.5* 12.0 - 15.0 % Final   ??? MPV 10/18/2016 10.6* 7.0 - 10.0 fL Final   ??? Platelet 10/18/2016 133* 150 - 440 10*9/L Final   ??? Variable HGB Concentration 10/18/2016 Slight* Not Present Final   ??? Absolute Neutrophils 10/18/2016 4.8  2.0 - 7.5 10*9/L Final   ??? Absolute Lymphocytes 10/18/2016 0.8* 1.5 - 5.0 10*9/L Final   ??? Absolute Monocytes 10/18/2016 0.4  0.2 - 0.8 10*9/L Final   ??? Absolute Eosinophils 10/18/2016 0.2  0.0 - 0.4 10*9/L Final   ??? Absolute Basophils 10/18/2016 0.0  0.0 - 0.1 10*9/L Final   ??? Large Unstained Cells 10/18/2016 1  0 - 4 % Final   ??? Microcytosis 10/18/2016 Slight* Not Present Final   ??? Anisocytosis 10/18/2016 Slight* Not Present Final   ??? Hypochromasia 10/18/2016 Slight* Not Present Final     IMAGING STUDIES:  MRI Brain (09/20/16):  Stable linear enhancement along the body and splenium of the  corpus callosum, right greater than left.    MRI Brain (08/09/16):  Interval recurrence of linear enhancement along the body and splenium of the corpus callosum, right greater than left and suspicion of recurrence.    MRI Brain (02/04/16):  Stable exam without evidence of recurrent disease.    MRI Brain (11/03/15):  No evidence of residual enhancement in the splenium of the corpus callosum. No new disease.  Similar appearance of right maxillary sinusitis with fungal component or inspissated secretions.    MRI Brain (08/04/15):  Stable, minimal residual enhancement in the splenium of the corpus callosum. No new disease.    MRI Brain 01/27/15: Post Tx WBRT MRI.  - Interval decrease in the size and enhancement of left posterior parietal periventricular lesion involving splenium of the corpus callosum demostrating decreased enhancement and reduced perilesional vasogenic edema. parietal mass with corpus callosal involvement, consistent with response to whole brain radiation therapy.    PATHOLOGY REPORTS:  11/13/14: Cerebrospinal fluid:  - Involved by monoclonal B-cell population (11% of lymphocytes)  - Review of the cytospin reveals a few large abnormal appearing mononuclear cells and a limited flow cytometry panel is positive for a monoclonal B-cell population. 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

## 2016-11-01 NOTE — Unmapped (Signed)
[facility] Refill Coordination Note  Specialty Medication(s): Imbruvica  Additional Medications shipped: na      TAYLOUR LIETZKE, DOB: 1940-08-08  Phone: [phone] (home) , Alternate phone contact: N/A  Phone or address changes today?: No  All above HIPAA information was verified with patient.  Shipping Address: [name]  [place] [address]   Insurance changes? No    Completed refill call assessment today to schedule patient's medication shipment from the [facility] [phone]).      Confirmed the medication and dosage are correct and have not changed: Yes, regimen is correct and unchanged.    Confirmed patient started or stopped the following medications in the past month:  No, there are no changes reported at this time.    Are you tolerating your medication?:  Fabienne reports tolerating the medication.    ADHERENCE    Did you miss any doses in the past 4 weeks? No missed doses reported.    FINANCIAL/SHIPPING    Delivery Scheduled: Yes, Expected medication delivery date: Friday, Aug 17     Krystina did not have any additional questions at this time.    Delivery address validated in [facility] scheduling system: Yes, address listed in [facility] is correct.    We will follow up with patient monthly for standard refill processing and delivery.      Thank you,  Tawanna Solo [facility] Specialty Pharmacist

## 2016-11-09 MED FILL — IMBRUVICA/560MG/TAB: IMBRUVICA/560MG/TAB | 28 days supply | Qty: 28 | Fill #0

## 2016-11-15 ENCOUNTER — Ambulatory Visit
Admission: RE | Admit: 2016-11-15 | Discharge: 2016-11-15 | Disposition: A | Discharge status: Home / Self Care | Payer: MEDICARE | Attending: Emergency Medicine | Admitting: Emergency Medicine

## 2016-11-15 ENCOUNTER — Ambulatory Visit
Admission: RE | Admit: 2016-11-15 | Discharge: 2016-11-15 | Disposition: A | Discharge status: Home / Self Care | Payer: MEDICARE

## 2016-11-15 DIAGNOSIS — C8589 Other specified types of non-Hodgkin lymphoma, extranodal and solid organ sites: Principal | ICD-10-CM

## 2016-11-15 LAB — CBC W/ AUTO DIFF
BASOPHILS ABSOLUTE COUNT: 0 10*9/L (ref 0.0–0.1)
EOSINOPHILS ABSOLUTE COUNT: 0.2 10*9/L (ref 0.0–0.4)
HEMATOCRIT: 30.6 % — ABNORMAL LOW (ref 36.0–46.0)
HEMOGLOBIN: 10.2 g/dL — ABNORMAL LOW (ref 12.0–16.0)
LYMPHOCYTES ABSOLUTE COUNT: 0.8 10*9/L — ABNORMAL LOW (ref 1.5–5.0)
MEAN CORPUSCULAR HEMOGLOBIN CONC: 33.4 g/dL (ref 31.0–37.0)
MEAN CORPUSCULAR HEMOGLOBIN: 29.3 pg (ref 26.0–34.0)
MEAN CORPUSCULAR VOLUME: 87.8 fL (ref 80.0–100.0)
MEAN PLATELET VOLUME: 10 fL (ref 7.0–10.0)
MONOCYTES ABSOLUTE COUNT: 0.3 10*9/L (ref 0.2–0.8)
NEUTROPHILS ABSOLUTE COUNT: 4.5 10*9/L (ref 2.0–7.5)
PLATELET COUNT: 151 10*9/L (ref 150–440)
RED BLOOD CELL COUNT: 3.48 10*12/L — ABNORMAL LOW (ref 4.00–5.20)
RED CELL DISTRIBUTION WIDTH: 17.2 % — ABNORMAL HIGH (ref 12.0–15.0)

## 2016-11-15 LAB — COMPREHENSIVE METABOLIC PANEL
ALBUMIN: 4.1 g/dL (ref 3.5–5.0)
ALKALINE PHOSPHATASE: 38 U/L (ref 38–126)
ANION GAP: 11 mmol/L (ref 9–15)
AST (SGOT): 23 U/L (ref 14–38)
BILIRUBIN TOTAL: 2.7 mg/dL — ABNORMAL HIGH (ref 0.0–1.2)
BLOOD UREA NITROGEN: 12 mg/dL (ref 7–21)
BUN / CREAT RATIO: 16
CALCIUM: 9.3 mg/dL (ref 8.5–10.2)
CHLORIDE: 104 mmol/L (ref 98–107)
CREATININE: 0.74 mg/dL (ref 0.60–1.00)
EGFR MDRD AF AMER: >=60 mL/min/{1.73_m2} (ref >=60)
EGFR MDRD NON AF AMER: >=60 mL/min/{1.73_m2} (ref >=60)
GLUCOSE RANDOM: 120 mg/dL (ref 65–179)
POTASSIUM: 4 mmol/L (ref 3.5–5.0)
PROTEIN TOTAL: 6.9 g/dL (ref 6.5–8.3)
SODIUM: 143 mmol/L (ref 135–145)

## 2016-11-15 LAB — LACTATE DEHYDROGENASE: Lactate dehydrogenase:CCnc:Pt:Ser/Plas:Qn:: 497

## 2016-11-15 LAB — SODIUM: Sodium:SCnc:Pt:Ser/Plas:Qn:: 143

## 2016-11-15 LAB — LYMPHOCYTES ABSOLUTE COUNT: Lab: 0.8 — ABNORMAL LOW

## 2016-11-15 NOTE — Unmapped (Signed)
Lab drawn and sent for analysis.

## 2016-11-15 NOTE — Unmapped (Signed)
Labwork:  Office Visit on 11/15/2016   Component Date Value Ref Range Status   ??? Sodium 11/15/2016 143  135 - 145 mmol/L Final   ??? Potassium 11/15/2016 4.0  3.5 - 5.0 mmol/L Final   ??? Chloride 11/15/2016 104  98 - 107 mmol/L Final   ??? CO2 11/15/2016 28.0  22.0 - 30.0 mmol/L Final   ??? BUN 11/15/2016 12  7 - 21 mg/dL Final   ??? Creatinine 11/15/2016 0.74  0.60 - 1.00 mg/dL Final   ??? BUN/Creatinine Ratio 11/15/2016 16   Final   ??? EGFR MDRD Non Af Amer 11/15/2016 >=60  >=60 mL/min/1.78m2 Final   ??? EGFR MDRD Af Amer 11/15/2016 >=60  >=60 mL/min/1.64m2 Final   ??? Anion Gap 11/15/2016 11  9 - 15 mmol/L Final   ??? Glucose 11/15/2016 120  65 - 179 mg/dL Final   ??? Calcium 45/40/9811 9.3  8.5 - 10.2 mg/dL Final   ??? Albumin 91/47/8295 4.1  3.5 - 5.0 g/dL Final   ??? Total Protein 11/15/2016 6.9  6.5 - 8.3 g/dL Final   ??? Total Bilirubin 11/15/2016 2.7* 0.0 - 1.2 mg/dL Final   ??? AST 62/13/0865 23  14 - 38 U/L Final   ??? ALT 11/15/2016 30  15 - 48 U/L Final   ??? Alkaline Phosphatase 11/15/2016 38  38 - 126 U/L Final   ??? LDH 11/15/2016 497  338 - 610 U/L Final     Please call us if you experience:   1. Nausea or vomiting not controlled by nausea medicines  2. Fever of 100.4 F or higher   3. Uncontrolled pain  4. Any other concerning symptom     For any cancer-related concerns, including:   Appointment information   Questions regarding your cancer diagnosis or treatment   Any new symptoms.     Please call [phone].    On Nights, Weekends and Holidays please and ask for the oncologist on call.    [facility]  [address]  [place], [place] [address]  [email or web address]

## 2016-11-16 NOTE — Unmapped (Signed)
IDENTIFICATION: This is a 76 y.o. 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 She is s/p WBRT and had a PR on MRI initially. Thereafter, her MRI showed further improvement and she has remained in a CR. She had been slowly improving her functional capacity, but at her 5/16 visit, she was more weak and slurring her speech more. 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 No chemotherapy 2/2 her baseline PS. Non-chemotherapy options for recurrent PCNSL include: ibrutinib CR: 42% (Choquet, ASH, 2016; Grommes, ASCO, 2017), revlimid CR: 30% (Ghesquieres ASH 2016), or nivolumab ORR: 100% (Nayak, Blood, 2017). Ibrutinib has been the most thoroughly studied with one study evaluating 52 patients. Nivolumab has excellent responses, but the study only evaluated 5 patients.     Decided to treat with ibrutinib and she has completed 3 months of therapy. MRI 09/15/16 showed stable disease, and MRI on 11/15/16 showed stable disease after 3 months of ibrutinib. Her CBC is stable and she has no bleeding events.  Of note, patient is on a baby ASA, which we will continue in setting of PAD - this is not a contraindication for ibrutinib. Will continue with the current approach. Pt will RTC in 1 month and if she is doing well, will have her RTC 2 months after that with an MRI brain. If all is stable, can move her out to q41mo visits with MRI every 24mo.    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     SUMMARY:  1. Pt clinically improving  2. MRI shows stable disease  3. C/w Ibrutinib 560mg  daily  4. RTC 1 mo for H&P and 46mo w/ MRI    A total of 25 minutes were spent face-to-face with the patient during this encounter and over half of that time was spent on counseling and coordination of care.     Winona Legato, DO, MPH  Assistant Professor of Medicine  Division of Hematology and Oncology  ------------------------------------------------------    INTERVAL HISTORY:  The patient feels her memory and speech is improving with time. She still has brief episode of confusion sometimes, but this is less than it had been. Her balance is still impaired so she uses a walker to ambulate. She reports the ibrutinib is going very well and she does not note any side effects. Denies B-symptoms or new focal deficits.    Oncology History    -- 11/15/14: presented with weakness, aphasia, blurry vision.  MRI showed a left temporoparietal mass that crossed the spine of the corpus callosum. This was suspicious for lymphoma. 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 PET/CT without any evidence of systemic lymphoma   -- 11/20/14: started WBRT.  Chemotherapy was deferred due to underlying MDS and age.  The total radiation dose will be 5040 cGy.   -- 01/27/15: post WBRT MRI brain shows interval decrease in the size and enhancement of left posterior parietal periventricular lesion.  The lesion measures approximately 0.7 x 1.8 x 0.8 cm (  previously 2.8 x 4.2 x 2.8 cm).         Primary CNS lymphoma (CMS-HCC)    11/10/2014 Initial Diagnosis     Primary CNS lymphoma (RAF-HCC)          REVIEW OF SYSTEMS:  See HPI. A 10 system ROS is otherwise negative.     PHYSICAL EXAMINATION:  VITAL SIGNS:   Vitals:    11/15/16 1329 11/15/16 1335   BP:  146/63   Pulse:  58   Resp:  18   Temp:  36.7 ??C (98.1 ??F)   TempSrc:  Oral   SpO2:  96%   Weight: 90.7 kg (199 lb 14.4 oz)      EXAM:  ECOG: 2  GENERAL: +slurred speech. Difficulty walking. AAOx3  HEENT: Pupils equal, round, and reactive to light.   NECK: Supple, no lymphadenopathy, no thyromegaly.  LUNGS: Clear to auscultation bilaterally.  HEART: Regular rate and rhythm. No R/G/M  ABDOMEN: Soft, nontender, nondistended.    EXTREMITIES: 1+ edema bilaterally   NEURO EXAM: Cranial nerves II-XII are intact. No focal deficits.  LYMPH NODES: No LAD    LABORATORY DATA:  Office Visit on 11/15/2016   Component Date Value Ref Range Status   ??? Sodium 11/15/2016 143  135 - 145 mmol/L Final   ??? Potassium 11/15/2016 4.0  3.5 - 5.0 mmol/L Final   ??? Chloride 11/15/2016 104  98 - 107 mmol/L Final   ??? CO2 11/15/2016 28.0  22.0 - 30.0 mmol/L Final   ??? BUN 11/15/2016 12  7 - 21 mg/dL Final   ??? Creatinine 11/15/2016 0.74  0.60 - 1.00 mg/dL Final   ??? BUN/Creatinine Ratio 11/15/2016 16   Final   ??? EGFR MDRD Non Af Amer 11/15/2016 >=60  >=60 mL/min/1.11m2 Final   ??? EGFR MDRD Af Amer 11/15/2016 >=60  >=60 mL/min/1.75m2 Final   ??? Anion Gap 11/15/2016 11  9 - 15 mmol/L Final   ??? Glucose 11/15/2016 120  65 - 179 mg/dL Final   ??? Calcium 16/12/9602 9.3  8.5 - 10.2 mg/dL Final   ??? Albumin 54/11/8117 4.1  3.5 - 5.0 g/dL Final   ??? Total Protein 11/15/2016 6.9  6.5 - 8.3 g/dL Final   ??? Total Bilirubin 11/15/2016 2.7* 0.0 - 1.2 mg/dL Final   ??? AST 14/78/2956 23  14 - 38 U/L Final   ??? ALT 11/15/2016 30  15 - 48 U/L Final   ??? Alkaline Phosphatase 11/15/2016 38  38 - 126 U/L Final   ??? LDH 11/15/2016 497  338 - 610 U/L Final   ??? WBC 11/15/2016 5.9  4.5 - 11.0 10*9/L Final   ??? RBC 11/15/2016 3.48* 4.00 - 5.20 10*12/L Final   ??? HGB 11/15/2016 10.2* 12.0 - 16.0 g/dL Final   ??? HCT 21/30/8657 30.6* 36.0 - 46.0 % Final   ??? MCV 11/15/2016 87.8  80.0 - 100.0 fL Final   ??? MCH 11/15/2016 29.3  26.0 - 34.0 pg Final   ??? MCHC 11/15/2016 33.4  31.0 - 37.0 g/dL Final   ??? RDW 84/69/6295 17.2* 12.0 - 15.0 % Final   ??? MPV 11/15/2016 10.0  7.0 - 10.0 fL Final   ??? Platelet 11/15/2016 151  150 - 440 10*9/L Final   ??? Variable HGB Concentration 11/15/2016 Slight* Not Present Final   ??? Absolute Neutrophils 11/15/2016 4.5  2.0 - 7.5 10*9/L Final   ??? Absolute Lymphocytes 11/15/2016 0.8* 1.5 - 5.0 10*9/L Final   ??? Absolute Monocytes 11/15/2016  0.3  0.2 - 0.8 10*9/L Final   ??? Absolute Eosinophils 11/15/2016 0.2  0.0 - 0.4 10*9/L Final   ??? Absolute Basophils 11/15/2016 0.0  0.0 - 0.1 10*9/L Final   ??? Large Unstained Cells 11/15/2016 1  0 - 4 % Final   ??? Microcytosis 11/15/2016 Slight* Not Present Final   ??? Anisocytosis 11/15/2016 Slight* Not Present Final   ??? Hypochromasia 11/15/2016 Slight* Not Present Final     IMAGING STUDIES:  MRI Brain (09/20/16):  Stable linear enhancement along the body and splenium of the corpus callosum, right greater than left.    MRI Brain (08/09/16):  Interval recurrence of linear enhancement along the body and splenium of the corpus callosum, right greater than left and suspicion of recurrence.    MRI Brain (02/04/16):  Stable exam without evidence of recurrent disease.    MRI Brain (11/03/15):  No evidence of residual enhancement in the splenium of the corpus callosum. No new disease.  Similar appearance of right maxillary sinusitis with fungal component or inspissated secretions.    MRI Brain (08/04/15):  Stable, minimal residual enhancement in the splenium of the corpus callosum. No new disease.    MRI Brain 01/27/15: Post Tx WBRT MRI.  - Interval decrease in the size and enhancement of left posterior parietal periventricular lesion involving splenium of the corpus callosum demostrating decreased enhancement and reduced perilesional vasogenic edema. parietal mass with corpus callosal involvement, consistent with response to whole brain radiation therapy.    PATHOLOGY REPORTS:  11/13/14: Cerebrospinal fluid:  - Involved by monoclonal B-cell population (11% of lymphocytes)  - Review of the cytospin reveals a few large abnormal appearing mononuclear cells and a limited flow cytometry panel is positive for a monoclonal B-cell population. 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

## 2016-12-01 NOTE — Unmapped (Signed)
Specialty Pharmacy Refill Coordination Note     Carolyn Andersen is a 76 y.o. female contacted today regarding refills of her specialty medication(s).    Reviewed and verified with patient:      Specialty medication(s) and dose(s) confirmed: yes  Changes to medications: no  Changes to insurance: no    Medication Adherence    Patient reported X missed doses in the last month:  0  Specialty Medication:  Imbruvica 560mg   Informant:  patient  Reliability of informant:  reliable  Provider-estimated medication adherence level:  90-100%  Confirmed plan for next specialty medication refill:  delivery by pharmacy  Medication Assistance Program  Refill Coordination  Has the Patient's Contact Information Changed:  No  Is the Shipping Address Different:  No  Shipping Information  Delivery Scheduled:  Yes  Delivery Date:  12/05/16  Medications to be Shipped:  Imbruvica 560mg           Follow-up: 4 week(s)     [employer]  Specialty Pharmacy Technician

## 2016-12-02 MED FILL — IMBRUVICA/560MG/TAB: IMBRUVICA/560MG/TAB | 28 days supply | Qty: 28 | Fill #1

## 2016-12-13 ENCOUNTER — Ambulatory Visit
Admission: RE | Admit: 2016-12-13 | Discharge: 2016-12-13 | Disposition: A | Discharge status: Home / Self Care | Payer: MEDICARE | Attending: Emergency Medicine | Admitting: Emergency Medicine

## 2016-12-13 ENCOUNTER — Ambulatory Visit
Admission: RE | Admit: 2016-12-13 | Discharge: 2016-12-13 | Disposition: A | Discharge status: Home / Self Care | Payer: MEDICARE

## 2016-12-13 DIAGNOSIS — C8589 Other specified types of non-Hodgkin lymphoma, extranodal and solid organ sites: Principal | ICD-10-CM

## 2016-12-13 LAB — COMPREHENSIVE METABOLIC PANEL
ALBUMIN: 4 g/dL (ref 3.5–5.0)
ALKALINE PHOSPHATASE: 35 U/L — ABNORMAL LOW (ref 38–126)
ALT (SGPT): 36 U/L (ref 15–48)
ANION GAP: 7 mmol/L — ABNORMAL LOW (ref 9–15)
AST (SGOT): 23 U/L (ref 14–38)
BILIRUBIN TOTAL: 2 mg/dL — ABNORMAL HIGH (ref 0.0–1.2)
BLOOD UREA NITROGEN: 11 mg/dL (ref 7–21)
BUN / CREAT RATIO: 15
CALCIUM: 9 mg/dL (ref 8.5–10.2)
CHLORIDE: 105 mmol/L (ref 98–107)
CO2: 29 mmol/L (ref 22.0–30.0)
CREATININE: 0.74 mg/dL (ref 0.60–1.00)
EGFR MDRD AF AMER: >=60 mL/min/{1.73_m2} (ref >=60)
EGFR MDRD NON AF AMER: >=60 mL/min/{1.73_m2} (ref >=60)
GLUCOSE RANDOM: 104 mg/dL (ref 65–179)
POTASSIUM: 4.3 mmol/L (ref 3.5–5.0)
PROTEIN TOTAL: 6.5 g/dL (ref 6.5–8.3)

## 2016-12-13 LAB — CBC W/ AUTO DIFF
BASOPHILS ABSOLUTE COUNT: 0 10*9/L (ref 0.0–0.1)
EOSINOPHILS ABSOLUTE COUNT: 0.2 10*9/L (ref 0.0–0.4)
HEMATOCRIT: 29.8 % — ABNORMAL LOW (ref 36.0–46.0)
HEMOGLOBIN: 9.9 g/dL — ABNORMAL LOW (ref 12.0–16.0)
LARGE UNSTAINED CELLS: 1 % (ref 0–4)
LYMPHOCYTES ABSOLUTE COUNT: 0.8 10*9/L — ABNORMAL LOW (ref 1.5–5.0)
MEAN CORPUSCULAR HEMOGLOBIN CONC: 33.4 g/dL (ref 31.0–37.0)
MEAN CORPUSCULAR VOLUME: 90.9 fL (ref 80.0–100.0)
MONOCYTES ABSOLUTE COUNT: 0.4 10*9/L (ref 0.2–0.8)
NEUTROPHILS ABSOLUTE COUNT: 4.3 10*9/L (ref 2.0–7.5)
PLATELET COUNT: 136 10*9/L — ABNORMAL LOW (ref 150–440)
RED BLOOD CELL COUNT: 3.27 10*12/L — ABNORMAL LOW (ref 4.00–5.20)
RED CELL DISTRIBUTION WIDTH: 17.5 % — ABNORMAL HIGH (ref 12.0–15.0)
WBC ADJUSTED: 5.8 10*9/L (ref 4.5–11.0)

## 2016-12-13 LAB — CHLORIDE: Chloride:SCnc:Pt:Ser/Plas:Qn:: 105

## 2016-12-13 LAB — ANISOCYTOSIS

## 2016-12-13 LAB — LACTATE DEHYDROGENASE: Lactate dehydrogenase:CCnc:Pt:Ser/Plas:Qn:: 497

## 2016-12-13 NOTE — Unmapped (Signed)
Labwork:  Office Visit on 12/13/2016   Component Date Value Ref Range Status   ??? Sodium 12/13/2016 141  135 - 145 mmol/L Final   ??? Potassium 12/13/2016 4.3  3.5 - 5.0 mmol/L Final   ??? Chloride 12/13/2016 105  98 - 107 mmol/L Final   ??? CO2 12/13/2016 29.0  22.0 - 30.0 mmol/L Final   ??? BUN 12/13/2016 11  7 - 21 mg/dL Final   ??? Creatinine 12/13/2016 0.74  0.60 - 1.00 mg/dL Final   ??? BUN/Creatinine Ratio 12/13/2016 15   Final   ??? EGFR MDRD Non Af Amer 12/13/2016 >=60  >=60 mL/min/1.35m2 Final   ??? EGFR MDRD Af Amer 12/13/2016 >=60  >=60 mL/min/1.71m2 Final   ??? Anion Gap 12/13/2016 7* 9 - 15 mmol/L Final   ??? Glucose 12/13/2016 104  65 - 179 mg/dL Final   ??? Calcium 16/12/9602 9.0  8.5 - 10.2 mg/dL Final   ??? Albumin 54/11/8117 4.0  3.5 - 5.0 g/dL Final   ??? Total Protein 12/13/2016 6.5  6.5 - 8.3 g/dL Final   ??? Total Bilirubin 12/13/2016 2.0* 0.0 - 1.2 mg/dL Final   ??? AST 14/78/2956 23  14 - 38 U/L Final   ??? ALT 12/13/2016 36  15 - 48 U/L Final   ??? Alkaline Phosphatase 12/13/2016 35* 38 - 126 U/L Final   ??? LDH 12/13/2016 497  338 - 610 U/L Final   ??? WBC 12/13/2016 5.8  4.5 - 11.0 10*9/L Final   ??? RBC 12/13/2016 3.27* 4.00 - 5.20 10*12/L Final   ??? HGB 12/13/2016 9.9* 12.0 - 16.0 g/dL Final   ??? HCT 21/30/8657 29.8* 36.0 - 46.0 % Final   ??? MCV 12/13/2016 90.9  80.0 - 100.0 fL Final   ??? MCH 12/13/2016 30.4  26.0 - 34.0 pg Final   ??? MCHC 12/13/2016 33.4  31.0 - 37.0 g/dL Final   ??? RDW 84/69/6295 17.5* 12.0 - 15.0 % Final   ??? MPV 12/13/2016 11.3* 7.0 - 10.0 fL Final   ??? Platelet 12/13/2016 136* 150 - 440 10*9/L Final   ??? Variable HGB Concentration 12/13/2016 Slight* Not Present Final   ??? Absolute Neutrophils 12/13/2016 4.3  2.0 - 7.5 10*9/L Final   ??? Absolute Lymphocytes 12/13/2016 0.8* 1.5 - 5.0 10*9/L Final   ??? Absolute Monocytes 12/13/2016 0.4  0.2 - 0.8 10*9/L Final   ??? Absolute Eosinophils 12/13/2016 0.2  0.0 - 0.4 10*9/L Final   ??? Absolute Basophils 12/13/2016 0.0  0.0 - 0.1 10*9/L Final   ??? Large Unstained Cells 12/13/2016 1  0 - 4 % Final   ??? Macrocytosis 12/13/2016 Slight* Not Present Final   ??? Anisocytosis 12/13/2016 Slight* Not Present Final   ??? Hypochromasia 12/13/2016 Slight* Not Present Final       Please call us if you experience:   1. Nausea or vomiting not controlled by nausea medicines  2. Fever of 100.4 F or higher   3. Uncontrolled pain  4. Any other concerning symptom     For any cancer-related concerns, including:   Appointment information   Questions regarding your cancer diagnosis or treatment   Any new symptoms.     Please call [phone].    On Nights, Weekends and Holidays please and ask for the oncologist on call.    [facility]  [address]  [place], [place] [address]  [email or web address]

## 2016-12-13 NOTE — Unmapped (Signed)
Lab drawn and sent for analysis, care provided by

## 2016-12-14 NOTE — Unmapped (Signed)
Referral to Outpatient Oncology Palliative Care Clinic ([facility])     12/13/16: NEW pt referral to Outpatient Palliative Care Clinic [facility]) from Dr. Aviva Kluver and Sherrilyn Rist, PA      Reason for referral:  further goals of care discussions    Summary:  PCNSL s/p WBRT whereupon in may 2018 exhibited progression in R corpus callosum region, causing weakness and slurred speech and has since started and completed 3 months of ibrutinib.  MRI on 11/15/16 showed stable disease and CBC is stable and she has no bleeding event. Was seen in ONC clinic on 12/13/16 and found to be overall declining and family interested in discussing what the transition process looks like and learning more about hospice and palliative services.     Currently DNR/DNI. Lives in [place] so could possibly qualify for home based PC via [facility] program with future transition to [facility] hospice is/when appropriate.     [facility] will contact to discuss options and/or offer appt on or before MRI appt on 10/15, depending on preference.      Plan:Marland Kitchen Appointment request sent to [facility] scheduler for next, new patient available appointment.        Allegra Lai RN BSN OCN MS  RN Clinical Coordinator  Outpatient Oncology Palliative Care ([facility])  [facility]  Pager: [phone]  Phone: [phone]

## 2016-12-15 NOTE — Unmapped (Signed)
RN spoke to patient's daughter in law Carolyn Andersen regarding palliative care referral from patient's oncologist.  Lives I [place], has continued difficulty walking d/t stiffness in joints, knees, shoulder, and neck worse in am. Denies problems with sleep.     RN offered info on [facility] and discussed option for [facility] at [facility]) and was amenable. RN contacted Carolyn Andersen to give report, they plan to contact today or early  Next week to set up home visit.     Carolyn Andersen cell; [phone] (call this number if calling today)  Carolyn Andersen (son) [phone] (call this number if next week or other)    Also discussed benefit of home PT, RN sent request to navigator Toni Amend to consider placing. Given contact info for future questions.

## 2016-12-17 NOTE — Unmapped (Signed)
IDENTIFICATION: This is a 76 y.o. 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 She is s/p WBRT and had a PR on MRI initially. Thereafter, her MRI showed further improvement and she has remained in a CR. She had been slowly improving her functional capacity, but at her 5/16 visit, she was more weak and slurring her speech more. 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 No chemotherapy 2/2 her baseline PS. Non-chemotherapy options for recurrent PCNSL include: ibrutinib CR: 42% (Choquet, ASH, 2016; Grommes, ASCO, 2017), revlimid CR: 30% (Ghesquieres ASH 2016), or nivolumab ORR: 100% (Nayak, Blood, 2017). Ibrutinib has been the most thoroughly studied with one study evaluating 52 patients. Nivolumab has excellent responses, but the study only evaluated 5 patients.     Decided to treat with ibrutinib and she has completed 4 months of therapy. MRI 09/15/16 showed stable disease, and MRI on 11/15/16 also showed stable disease. Her CBC is relatively stable and she has no bleeding events. Of note, patient is on a baby ASA, which we will continue in setting of PAD - this is not a contraindication for ibrutinib. I was going to begin to lengthen the f/u interval but she is clinically worse today. Her worsening cognitive function is more likely radiation induced, but I will obtain another MRI in a month to ensure her lymphoma is not progressing. Will c/w ibrutinib 560mg  daily for the next month. If the MRI shows progression, will likely pursue hospice. If it shows stable disease or improvement, will c/w therapy along with palliative care support. Pt will RTC in 1 month with MRI brain and palliative care visit. Of note, the patient wishes to be DNR/DNI - this was documented in the chart.    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     SUMMARY:  1. Pt clinically worse today (cognition, weakness)  2. Possible from XRT vs progression  3. C/w Ibrutinib 560mg  daily  4. RTC 1 mo with MRI  5. Palliative care to see patient at next visit.    A total of 40 minutes were spent face-to-face with the patient during this encounter and over half of that time was spent on counseling and coordination of care.     Winona Legato, DO, MPH  Assistant Professor of Medicine  Division of Hematology and Oncology  ------------------------------------------------------    INTERVAL HISTORY:  After initial improvement, the patient now states that she feels her cognitive function/memory is declining. Additionally, she feels she is weaker than previously and is having more difficulty moving around her house. Her balance is impaired so she uses a walker to ambulate. Denies B-symptoms or new focal deficits.    Oncology History    -- 11/15/14: presented with weakness, aphasia, blurry vision.  MRI showed a left temporoparietal mass that crossed the spine of the corpus callosum. This was suspicious for lymphoma. 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 PET/CT without any evidence of systemic lymphoma   -- 11/20/14: started WBRT.  Chemotherapy was deferred due to underlying MDS and age.  The total radiation dose will be 5040 cGy.   -- 01/27/15: post WBRT MRI brain shows interval decrease in the size and enhancement of left posterior parietal periventricular lesion.  The lesion measures approximately 0.7 x 1.8 x 0.8 cm (previously 2.8 x 4.2 x 2.8 cm).         Primary CNS lymphoma (CMS-HCC)    11/10/2014 Initial Diagnosis     Primary CNS lymphoma (RAF-HCC)          REVIEW OF SYSTEMS:  See HPI. A 10 system ROS is otherwise negative.     PHYSICAL EXAMINATION:  VITAL SIGNS:   Vitals:    12/13/16 0816   BP: 137/61   Pulse: 57   Resp: 16   Temp: 36.4 ??C (97.5 ??F)   TempSrc: Oral   SpO2: 97%   Weight: 93.7 kg (206 lb 9.6 oz)   Height: 162.6 cm (5' 4.02)     EXAM:  ECOG: 2  GENERAL: +slurred speech. Worsening cognitive decline. Difficulty walking/increased weakness. AAOx3  HEENT: Pupils equal, round, and reactive to light.   NECK: Supple, no lymphadenopathy, no thyromegaly.  LUNGS: Clear to auscultation bilaterally.  HEART: Regular rate and rhythm. No R/G/M  ABDOMEN: Soft, nontender, nondistended.    EXTREMITIES: 1+ edema bilaterally   NEURO EXAM: Motor/sensory intact BL UE and LE. Cranial nerves II-XII are intact.   LYMPH NODES: No LAD    LABORATORY DATA:  Office Visit on 12/13/2016   Component Date Value Ref Range Status   ??? Sodium 12/13/2016 141  135 - 145 mmol/L Final   ??? Potassium 12/13/2016 4.3  3.5 - 5.0 mmol/L Final   ??? Chloride 12/13/2016 105  98 - 107 mmol/L Final   ??? CO2 12/13/2016 29.0  22.0 - 30.0 mmol/L Final   ??? BUN 12/13/2016 11  7 - 21 mg/dL Final   ??? Creatinine 12/13/2016 0.74  0.60 - 1.00 mg/dL Final   ??? BUN/Creatinine Ratio 12/13/2016 15   Final   ??? EGFR MDRD Non Af Amer 12/13/2016 >=60  >=60 mL/min/1.58m2 Final   ??? EGFR MDRD Af Amer 12/13/2016 >=60  >=60 mL/min/1.38m2 Final   ??? Anion Gap 12/13/2016 7* 9 - 15 mmol/L Final   ??? Glucose 12/13/2016 104  65 - 179 mg/dL Final   ??? Calcium 16/12/9602 9.0  8.5 - 10.2 mg/dL Final   ??? Albumin 54/11/8117 4.0  3.5 - 5.0 g/dL Final   ??? Total Protein 12/13/2016 6.5  6.5 - 8.3 g/dL Final   ??? Total Bilirubin 12/13/2016 2.0* 0.0 - 1.2 mg/dL Final   ??? AST 14/78/2956 23  14 - 38 U/L Final   ??? ALT 12/13/2016 36  15 - 48 U/L Final   ??? Alkaline Phosphatase 12/13/2016 35* 38 - 126 U/L Final   ??? LDH 12/13/2016 497  338 - 610 U/L Final   ??? WBC 12/13/2016 5.8  4.5 - 11.0 10*9/L Final   ??? RBC 12/13/2016 3.27* 4.00 - 5.20 10*12/L Final   ??? HGB 12/13/2016 9.9* 12.0 - 16.0 g/dL Final   ??? HCT 21/30/8657 29.8* 36.0 - 46.0 % Final   ??? MCV 12/13/2016 90.9  80.0 - 100.0 fL Final   ??? MCH 12/13/2016 30.4  26.0 - 34.0 pg Final   ??? MCHC 12/13/2016 33.4  31.0 - 37.0 g/dL Final   ???  RDW 12/13/2016 17.5* 12.0 - 15.0 % Final   ??? MPV 12/13/2016 11.3* 7.0 - 10.0 fL Final   ??? Platelet 12/13/2016 136* 150 - 440 10*9/L Final   ??? Variable HGB Concentration 12/13/2016 Slight* Not Present Final   ??? Absolute Neutrophils 12/13/2016 4.3  2.0 - 7.5 10*9/L Final   ??? Absolute Lymphocytes 12/13/2016 0.8* 1.5 - 5.0 10*9/L Final   ??? Absolute Monocytes 12/13/2016 0.4  0.2 - 0.8 10*9/L Final   ??? Absolute Eosinophils 12/13/2016 0.2  0.0 - 0.4 10*9/L Final   ??? Absolute Basophils 12/13/2016 0.0  0.0 - 0.1 10*9/L Final   ??? Large Unstained Cells 12/13/2016 1  0 - 4 % Final   ??? Macrocytosis 12/13/2016 Slight* Not Present Final   ??? Anisocytosis 12/13/2016 Slight* Not Present Final   ??? Hypochromasia 12/13/2016 Slight* Not Present Final     IMAGING STUDIES:  MRI Brain (11/15/16):  Unchanged linear enhancement in the region of the splenium of the corpus callosum. No new enhancing lesions.    MRI Brain (09/20/16):  Stable linear enhancement along the body and splenium of the corpus callosum, right greater than left.    MRI Brain (08/09/16):  Interval recurrence of linear enhancement along the body and splenium of the corpus callosum, right greater than left and suspicion of recurrence.    MRI Brain (02/04/16):  Stable exam without evidence of recurrent disease.    MRI Brain (11/03/15):  No evidence of residual enhancement in the splenium of the corpus callosum. No new disease.  Similar appearance of right maxillary sinusitis with fungal component or inspissated secretions.    MRI Brain (08/04/15):  Stable, minimal residual enhancement in the splenium of the corpus callosum. No new disease.    MRI Brain 01/27/15: Post Tx WBRT MRI.  - Interval decrease in the size and enhancement of left posterior parietal periventricular lesion involving splenium of the corpus callosum demostrating decreased enhancement and reduced perilesional vasogenic edema. parietal mass with corpus callosal involvement, consistent with response to whole brain radiation therapy.    PATHOLOGY REPORTS:  11/13/14: Cerebrospinal fluid:  - Involved by monoclonal B-cell population (11% of lymphocytes)  - Review of the cytospin reveals a few large abnormal appearing mononuclear cells and a limited flow cytometry panel is positive for a monoclonal B-cell population. 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

## 2016-12-18 MED ORDER — IBRUTINIB 560 MG TABLET
ORAL_TABLET | Freq: Every day | ORAL | 2 refills | 0.00000 days | Status: CP
Start: 2016-12-18 — End: 2017-02-19

## 2016-12-18 MED ORDER — IBRUTINIB 560 MG TABLET: 560 mg | tablet | Freq: Every day | 2 refills | 0 days | Status: AC

## 2016-12-25 NOTE — Unmapped (Signed)
[facility] Refill Coordination Note  Specialty Medication(s): IMBRUVICA  Additional Medications shipped: NO    AMMIE WARRICK, DOB: [birth year]  Phone: [phone] (home) , Alternate phone contact: N/A  Phone or address changes today?: No  All above HIPAA information was verified with patient.  Shipping Address: [address]  [place] [address]   Insurance changes? No    Completed refill call assessment today to schedule patient's medication shipment from the [facility] [phone]).      Confirmed the medication and dosage are correct and have not changed: Yes, regimen is correct and unchanged.    Confirmed patient started or stopped the following medications in the past month:  No, there are no changes reported at this time.    Are you tolerating your medication?:  Malerie reports tolerating the medication.    ADHERENCE    Did you miss any doses in the past 4 weeks? No missed doses reported.    FINANCIAL/SHIPPING    Delivery Scheduled: Yes, Expected medication delivery date: 12/28/16     Patra did not have any additional questions at this time.    Delivery address validated in [facility] scheduling system: Yes, address listed in [facility] is correct.    We will follow up with patient monthly for standard refill processing and delivery.      Thank you,  Marletta Lor   [facility] Specialty Pharmacist

## 2016-12-27 MED FILL — IMBRUVICA/560MG/TAB: IMBRUVICA/560MG/TAB | 28 days supply | Qty: 28 | Fill #2

## 2017-01-08 ENCOUNTER — Ambulatory Visit
Admission: RE | Admit: 2017-01-08 | Discharge: 2017-01-08 | Disposition: A | Discharge status: Home / Self Care | Payer: MEDICARE

## 2017-01-08 DIAGNOSIS — C8589 Other specified types of non-Hodgkin lymphoma, extranodal and solid organ sites: Principal | ICD-10-CM

## 2017-01-17 ENCOUNTER — Ambulatory Visit
Admission: RE | Admit: 2017-01-17 | Discharge: 2017-01-17 | Disposition: A | Discharge status: Home / Self Care | Payer: MEDICARE

## 2017-01-17 DIAGNOSIS — C8589 Other specified types of non-Hodgkin lymphoma, extranodal and solid organ sites: Principal | ICD-10-CM

## 2017-01-17 LAB — LARGE UNSTAINED CELLS: Lab: 1

## 2017-01-17 LAB — CBC W/ AUTO DIFF
EOSINOPHILS ABSOLUTE COUNT: 0.2 10*9/L (ref 0.0–0.4)
HEMATOCRIT: 29.1 % — ABNORMAL LOW (ref 36.0–46.0)
HEMOGLOBIN: 9.8 g/dL — ABNORMAL LOW (ref 12.0–16.0)
LYMPHOCYTES ABSOLUTE COUNT: 0.8 10*9/L — ABNORMAL LOW (ref 1.5–5.0)
MEAN CORPUSCULAR HEMOGLOBIN CONC: 33.6 g/dL (ref 31.0–37.0)
MEAN CORPUSCULAR HEMOGLOBIN: 30.9 pg (ref 26.0–34.0)
MEAN CORPUSCULAR VOLUME: 92.1 fL (ref 80.0–100.0)
MEAN PLATELET VOLUME: 10.8 fL — ABNORMAL HIGH (ref 7.0–10.0)
MONOCYTES ABSOLUTE COUNT: 0.4 10*9/L (ref 0.2–0.8)
NEUTROPHILS ABSOLUTE COUNT: 5.2 10*9/L (ref 2.0–7.5)
PLATELET COUNT: 130 10*9/L — ABNORMAL LOW (ref 150–440)
RED CELL DISTRIBUTION WIDTH: 17.2 % — ABNORMAL HIGH (ref 12.0–15.0)
WBC ADJUSTED: 6.7 10*9/L (ref 4.5–11.0)

## 2017-01-17 LAB — COMPREHENSIVE METABOLIC PANEL
ALBUMIN: 3.9 g/dL (ref 3.5–5.0)
ALKALINE PHOSPHATASE: 39 U/L (ref 38–126)
ALT (SGPT): 44 U/L (ref 15–48)
ANION GAP: 10 mmol/L (ref 9–15)
AST (SGOT): 26 U/L (ref 14–38)
BLOOD UREA NITROGEN: 15 mg/dL (ref 7–21)
BUN / CREAT RATIO: 19
CALCIUM: 8.8 mg/dL (ref 8.5–10.2)
CHLORIDE: 106 mmol/L (ref 98–107)
CO2: 29 mmol/L (ref 22.0–30.0)
CREATININE: 0.77 mg/dL (ref 0.60–1.00)
EGFR MDRD AF AMER: >=60 mL/min/{1.73_m2} (ref >=60)
EGFR MDRD NON AF AMER: >=60 mL/min/{1.73_m2} (ref >=60)
GLUCOSE RANDOM: 110 mg/dL (ref 65–179)
POTASSIUM: 4.2 mmol/L (ref 3.5–5.0)
SODIUM: 145 mmol/L (ref 135–145)

## 2017-01-17 LAB — BLOOD UREA NITROGEN: Urea nitrogen:MCnc:Pt:Ser/Plas:Qn:: 15

## 2017-01-17 LAB — LACTATE DEHYDROGENASE
LACTATE DEHYDROGENASE: 505 U/L (ref 338–610)
Lactate dehydrogenase:CCnc:Pt:Ser/Plas:Qn:: 505

## 2017-01-17 NOTE — Unmapped (Signed)
Lab on 01/17/2017   Component Date Value Ref Range Status   ??? LDH 01/17/2017 505  338 - 610 U/L Final   ??? Sodium 01/17/2017 145  135 - 145 mmol/L Final   ??? Potassium 01/17/2017 4.2  3.5 - 5.0 mmol/L Final   ??? Chloride 01/17/2017 106  98 - 107 mmol/L Final   ??? CO2 01/17/2017 29.0  22.0 - 30.0 mmol/L Final   ??? BUN 01/17/2017 15  7 - 21 mg/dL Final   ??? Creatinine 01/17/2017 0.77  0.60 - 1.00 mg/dL Final   ??? BUN/Creatinine Ratio 01/17/2017 19   Final   ??? EGFR MDRD Non Af Amer 01/17/2017 >=60  >=60 mL/min/1.61m2 Final   ??? EGFR MDRD Af Amer 01/17/2017 >=60  >=60 mL/min/1.65m2 Final   ??? Anion Gap 01/17/2017 10  9 - 15 mmol/L Final   ??? Glucose 01/17/2017 110  65 - 179 mg/dL Final   ??? Calcium 91/47/8295 8.8  8.5 - 10.2 mg/dL Final   ??? Albumin 62/13/0865 3.9  3.5 - 5.0 g/dL Final   ??? Total Protein 01/17/2017 6.5  6.5 - 8.3 g/dL Final   ??? Total Bilirubin 01/17/2017 1.8* 0.0 - 1.2 mg/dL Final   ??? AST 78/46/9629 26  14 - 38 U/L Final   ??? ALT 01/17/2017 44  15 - 48 U/L Final   ??? Alkaline Phosphatase 01/17/2017 39  38 - 126 U/L Final   ??? WBC 01/17/2017 6.7  4.5 - 11.0 10*9/L Final   ??? RBC 01/17/2017 3.16* 4.00 - 5.20 10*12/L Final   ??? HGB 01/17/2017 9.8* 12.0 - 16.0 g/dL Final   ??? HCT 52/84/1324 29.1* 36.0 - 46.0 % Final   ??? MCV 01/17/2017 92.1  80.0 - 100.0 fL Final   ??? MCH 01/17/2017 30.9  26.0 - 34.0 pg Final   ??? MCHC 01/17/2017 33.6  31.0 - 37.0 g/dL Final   ??? RDW 40/12/2723 17.2* 12.0 - 15.0 % Final   ??? MPV 01/17/2017 10.8* 7.0 - 10.0 fL Final   ??? Platelet 01/17/2017 130* 150 - 440 10*9/L Final   ??? Absolute Neutrophils 01/17/2017 5.2  2.0 - 7.5 10*9/L Final   ??? Absolute Lymphocytes 01/17/2017 0.8* 1.5 - 5.0 10*9/L Final   ??? Absolute Monocytes 01/17/2017 0.4  0.2 - 0.8 10*9/L Final   ??? Absolute Eosinophils 01/17/2017 0.2  0.0 - 0.4 10*9/L Final   ??? Absolute Basophils 01/17/2017 0.0  0.0 - 0.1 10*9/L Final   ??? Large Unstained Cells 01/17/2017 1  0 - 4 % Final   ??? Macrocytosis 01/17/2017 Slight* Not Present Final   ??? Anisocytosis 01/17/2017 Slight* Not Present Final   ??? Hypochromasia 01/17/2017 Slight* Not Present Final       Please call us if you experience:   1. Nausea or vomiting not controlled by nausea medicines  2. Fever of 100.5 F or higher   3. Uncontrolled pain  4. Any other concerning symptom     For health related questions call: Please call the office at [phone] and ask for Margaretmary Lombard, our nurse navigator.  For appointment changes call: Main Clinic [phone]  After hours call [facility] and ask for the Oncology Fellow on call: [phone]    Ivor Costa. Thera Flake, PA-C    Dr. Thayer Ohm Dittus  [facility] Hematology/Oncology

## 2017-01-17 NOTE — Unmapped (Signed)
Lab drawn and sent for analysis.

## 2017-01-18 NOTE — Unmapped (Signed)
IDENTIFICATION: This is a 76 y.o. 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 She is s/p WBRT and had a PR on MRI initially. Thereafter, her MRI showed further improvement and she has remained in a CR. She had been slowly improving her functional capacity, but at her 5/16 visit, she was more weak and slurring her speech more. 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 No chemotherapy 2/2 her baseline PS. Non-chemotherapy options for recurrent PCNSL include: ibrutinib CR: 42% (Choquet, ASH, 2016; Grommes, ASCO, 2017), revlimid CR: 30% (Ghesquieres ASH 2016), or nivolumab ORR: 100% (Nayak, Blood, 2017). Ibrutinib has been the most thoroughly studied with one study evaluating 52 patients. Nivolumab has excellent responses, but the study only evaluated 5 patients.     Decided to treat with ibrutinib and she has completed 4 months of therapy. MRI 09/15/16 showed stable disease, and MRI on 11/15/16 also showed stable disease. Her CBC is relatively stable and she has no bleeding events. Of note, patient is on a baby ASA, which we will continue in setting of PAD - this is not a contraindication for ibrutinib. Patient is overall stable today, as is MRI. Will c/w ibrutinib 560mg  daily for the next month. Will plan for MRI every 2-3 months. Of note, the patient wishes to be DNR/DNI - this was documented in the chart.    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     SUMMARY:  1. Pt clinically stable today  2. Cognitive issues likely 2/2 radiation, but will monitor closely  3. C/w Ibrutinib 560mg  daily  4. RTC 1 mo   5. Palliative care seeing patient locally.    Carolyn Maxton K. Thera Flake, PA-C  Division of Hematology and Oncology  ------------------------------------------------------    INTERVAL HISTORY:  Patient and son report that overall she is stable. She has weakness and cognitive impairment, especially with short term memory, but there have not been any major changes. Denies B-symptoms or new focal deficits.    Oncology History    -- 11/15/14: presented with weakness, aphasia, blurry vision.  MRI showed a left temporoparietal mass that crossed the spine of the corpus callosum. This was suspicious for lymphoma. 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 PET/CT without any evidence of systemic lymphoma   -- 11/20/14: started WBRT.  Chemotherapy was deferred due to underlying MDS and age.  The total radiation dose will be 5040 cGy.   -- 01/27/15: post WBRT MRI brain shows interval decrease in the size and enhancement of left posterior parietal periventricular lesion.  The lesion measures approximately 0.7 x 1.8 x 0.8 cm (previously 2.8 x 4.2 x 2.8 cm).         Primary CNS lymphoma (CMS-HCC)    11/10/2014 Initial Diagnosis     Primary CNS lymphoma (RAF-HCC)          REVIEW OF SYSTEMS:  See HPI. A 10 system ROS is otherwise negative.     PHYSICAL EXAMINATION:  VITAL SIGNS:   Vitals:    01/17/17 0906   BP: 135/61   Pulse: 67   Resp: 18   Temp: 36.4 ??C (97.5 ??F)   TempSrc: Oral   SpO2: 98%   Weight: 94.9 kg (209 lb 3.2 oz)   Height: 162.6 cm (5' 4.02)     EXAM:  ECOG: 2  GENERAL: +slurred speech.  AAOx3  HEENT: Pupils equal, round, and reactive to light.   NECK: Supple, no lymphadenopathy, no thyromegaly.  LUNGS: Clear to auscultation bilaterally.  HEART: Regular rate and rhythm. No R/G/M  ABDOMEN: Soft, nontender, nondistended.    EXTREMITIES: 1+ edema bilaterally NEURO EXAM: Motor/sensory intact BL UE and LE. Cranial nerves II-XII are intact.   LYMPH NODES: No LAD    LABORATORY DATA:  Lab on 01/17/2017   Component Date Value Ref Range Status   ??? LDH 01/17/2017 505  338 - 610 U/L Final   ??? Sodium 01/17/2017 145  135 - 145 mmol/L Final   ??? Potassium 01/17/2017 4.2  3.5 - 5.0 mmol/L Final   ??? Chloride 01/17/2017 106  98 - 107 mmol/L Final   ??? CO2 01/17/2017 29.0  22.0 - 30.0 mmol/L Final   ??? BUN 01/17/2017 15  7 - 21 mg/dL Final   ??? Creatinine 01/17/2017 0.77  0.60 - 1.00 mg/dL Final   ??? BUN/Creatinine Ratio 01/17/2017 19   Final   ??? EGFR MDRD Non Af Amer 01/17/2017 >=60  >=60 mL/min/1.3m2 Final   ??? EGFR MDRD Af Amer 01/17/2017 >=60  >=60 mL/min/1.75m2 Final   ??? Anion Gap 01/17/2017 10  9 - 15 mmol/L Final   ??? Glucose 01/17/2017 110  65 - 179 mg/dL Final   ??? Calcium 14/78/2956 8.8  8.5 - 10.2 mg/dL Final   ??? Albumin 21/30/8657 3.9  3.5 - 5.0 g/dL Final   ??? Total Protein 01/17/2017 6.5  6.5 - 8.3 g/dL Final   ??? Total Bilirubin 01/17/2017 1.8* 0.0 - 1.2 mg/dL Final   ??? AST 84/69/6295 26  14 - 38 U/L Final   ??? ALT 01/17/2017 44  15 - 48 U/L Final   ??? Alkaline Phosphatase 01/17/2017 39  38 - 126 U/L Final   ??? WBC 01/17/2017 6.7  4.5 - 11.0 10*9/L Final   ??? RBC 01/17/2017 3.16* 4.00 - 5.20 10*12/L Final   ??? HGB 01/17/2017 9.8* 12.0 - 16.0 g/dL Final   ??? HCT 28/41/3244 29.1* 36.0 - 46.0 % Final   ??? MCV 01/17/2017 92.1  80.0 - 100.0 fL Final   ??? MCH 01/17/2017 30.9  26.0 - 34.0 pg Final   ??? MCHC 01/17/2017 33.6  31.0 - 37.0 g/dL Final   ??? RDW 03/29/7251 17.2* 12.0 - 15.0 % Final   ??? MPV 01/17/2017 10.8* 7.0 - 10.0 fL Final   ??? Platelet 01/17/2017 130* 150 - 440 10*9/L Final   ??? Absolute Neutrophils 01/17/2017 5.2  2.0 - 7.5 10*9/L Final   ??? Absolute Lymphocytes 01/17/2017 0.8* 1.5 - 5.0 10*9/L Final   ??? Absolute Monocytes 01/17/2017 0.4  0.2 - 0.8 10*9/L Final   ??? Absolute Eosinophils 01/17/2017 0.2  0.0 - 0.4 10*9/L Final   ??? Absolute Basophils 01/17/2017 0.0  0.0 - 0.1 10*9/L Final ??? Large Unstained Cells 01/17/2017 1  0 - 4 % Final   ??? Macrocytosis 01/17/2017 Slight* Not Present Final   ??? Anisocytosis 01/17/2017 Slight* Not  Present Final   ??? Hypochromasia 01/17/2017 Slight* Not Present Final     IMAGING STUDIES:  MRI Brain (11/15/16):  Unchanged linear enhancement in the region of the splenium of the corpus callosum. No new enhancing lesions.    MRI Brain (09/20/16):  Stable linear enhancement along the body and splenium of the corpus callosum, right greater than left.    MRI Brain (08/09/16):  Interval recurrence of linear enhancement along the body and splenium of the corpus callosum, right greater than left and suspicion of recurrence.    MRI Brain (02/04/16):  Stable exam without evidence of recurrent disease.    MRI Brain (11/03/15):  No evidence of residual enhancement in the splenium of the corpus callosum. No new disease.  Similar appearance of right maxillary sinusitis with fungal component or inspissated secretions.    MRI Brain (08/04/15):  Stable, minimal residual enhancement in the splenium of the corpus callosum. No new disease.    MRI Brain 01/27/15: Post Tx WBRT MRI.  - Interval decrease in the size and enhancement of left posterior parietal periventricular lesion involving splenium of the corpus callosum demostrating decreased enhancement and reduced perilesional vasogenic edema. parietal mass with corpus callosal involvement, consistent with response to whole brain radiation therapy.    PATHOLOGY REPORTS:  11/13/14: Cerebrospinal fluid:  - Involved by monoclonal B-cell population (11% of lymphocytes)  - Review of the cytospin reveals a few large abnormal appearing mononuclear cells and a limited flow cytometry panel is positive for a monoclonal B-cell population. 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

## 2017-02-08 MED FILL — IMBRUVICA/560MG/TAB: IMBRUVICA/560MG/TAB | 28 days supply | Qty: 28 | Fill #0

## 2017-02-08 NOTE — Unmapped (Signed)
Will see if team can send in refill. Directed son to pharmacy.

## 2017-02-08 NOTE — Unmapped (Signed)
Jannette Spanner, son of patient LARIE MATHES called requesting a medication refill for the following:    ? Medication: IMBRUVICA  ? Dosage: 560 mg tablet  ? Days left of medication: 1    Patient???s pharmacy has been updated/verified in the system. Alex would like to know if it is possible for him to com to the out patient pharmacy at the cancer center to pick up this medication today before 8:00 pm?     Thank you,  Drema Balzarine  [facility]  [phone]

## 2017-02-09 NOTE — Unmapped (Signed)
[facility] Refill Coordination Note    Specialty Program and Medication(s) to be Shipped:   Lymphoma: Imbruvica 560mg *  Other medications to be shipped: none     Carolyn Andersen, DOB: 1941/02/27  Phone: [phone] (home)   Shipping Address: [address]  [place] [address]  All above HIPAA information was verified with patient's family member.     Completed refill call assessment today to schedule patient's medication shipment from the [facility] [phone]).       Medications reviewed and verified: Allergies -      Specialty medication(s) and dose(s) confirmed: yes  Changes to medications: no  Changes to insurance: no  Tolerating medications:   Adverse Effects    *All other systems reviewed and are negative          DISEASE-SPECIFIC INFORMATION        N/A    ADHERENCE     Medication Adherence    Patient Reported X Missed Doses in the Last Month:  0  Specialty Medication:  Imbruvica 560mg   Patient is on additional specialty medications:  No  Gaps in Refill History Greater than 2 Weeks in the Last 3 Months:  No  Demonstrates Understanding of Importance of Adherence:  Yes  Informant:  child/children  Reliability of Informant:  reliable  Provider-Estimated Medication Adherence Level:  good  Patient is at risk for Non-Adherence:  No   Support Network for Adherence:  family member  Confirmed Plan for Next Specialty Medication Refill:  delivery by pharmacy  Medication Assistance Program  Refill Coordination  Has the Patients' Contact Information Changed:  No    Is the Shipping Address Different:  No    Shipping Information  Delivery Scheduled:  Yes  Delivery Date:  02/09/17  Medications to be Shipped:  Imbruvica 560mg       (change to the new smartlinks)    (Below is required for Medicare Part B billed medications only - per drug):   Quantity dispensed last month: 28  Remaining supply on hand: 1 tablet      SHIPPING     Delivery Scheduled: yes, Expected medication delivery date: 02/09/17      Corliss Skains. [place], [place].[facility]  [phone] option 4

## 2017-02-19 MED ORDER — IBRUTINIB 560 MG TABLET
Freq: Every day | ORAL | 2 refills | 0.00000 days | Status: CP
Start: 2017-02-19 — End: 2017-02-19

## 2017-02-19 MED ORDER — IBRUTINIB 560 MG TABLET: each | 2 refills | 0 days

## 2017-02-21 ENCOUNTER — Ambulatory Visit
Admission: RE | Admit: 2017-02-21 | Discharge: 2017-02-21 | Disposition: A | Discharge status: Home / Self Care | Payer: MEDICARE

## 2017-02-21 ENCOUNTER — Ambulatory Visit
Admission: RE | Admit: 2017-02-21 | Discharge: 2017-02-21 | Disposition: A | Discharge status: Home / Self Care | Payer: MEDICARE | Attending: Emergency Medicine | Admitting: Emergency Medicine

## 2017-02-21 DIAGNOSIS — C8589 Other specified types of non-Hodgkin lymphoma, extranodal and solid organ sites: Principal | ICD-10-CM

## 2017-02-21 DIAGNOSIS — C946 Myelodysplastic disease, not classified: Secondary | ICD-10-CM

## 2017-02-21 DIAGNOSIS — Z794 Long term (current) use of insulin: Secondary | ICD-10-CM

## 2017-02-21 DIAGNOSIS — E118 Type 2 diabetes mellitus with unspecified complications: Secondary | ICD-10-CM

## 2017-02-21 DIAGNOSIS — I10 Essential (primary) hypertension: Secondary | ICD-10-CM

## 2017-02-21 LAB — COMPREHENSIVE METABOLIC PANEL
ALKALINE PHOSPHATASE: 54 U/L (ref 38–126)
ALT (SGPT): 30 U/L (ref 15–48)
ANION GAP: 11 mmol/L (ref 9–15)
AST (SGOT): 25 U/L (ref 14–38)
BILIRUBIN TOTAL: 1.4 mg/dL — ABNORMAL HIGH (ref 0.0–1.2)
BLOOD UREA NITROGEN: 16 mg/dL (ref 7–21)
BUN / CREAT RATIO: 20
CALCIUM: 9.1 mg/dL (ref 8.5–10.2)
CHLORIDE: 104 mmol/L (ref 98–107)
CO2: 29 mmol/L (ref 22.0–30.0)
CREATININE: 0.81 mg/dL (ref 0.60–1.00)
EGFR MDRD AF AMER: >=60 mL/min/{1.73_m2} (ref >=60)
POTASSIUM: 3.9 mmol/L (ref 3.5–5.0)
PROTEIN TOTAL: 6.8 g/dL (ref 6.5–8.3)
SODIUM: 144 mmol/L (ref 135–145)

## 2017-02-21 LAB — CBC W/ AUTO DIFF
BASOPHILS ABSOLUTE COUNT: 0.1 10*9/L (ref 0.0–0.1)
EOSINOPHILS ABSOLUTE COUNT: 0.2 10*9/L (ref 0.0–0.4)
HEMATOCRIT: 33.1 % — ABNORMAL LOW (ref 36.0–46.0)
HEMOGLOBIN: 10.9 g/dL — ABNORMAL LOW (ref 12.0–16.0)
LARGE UNSTAINED CELLS: 1 % (ref 0–4)
LYMPHOCYTES ABSOLUTE COUNT: 0.7 10*9/L — ABNORMAL LOW (ref 1.5–5.0)
MEAN CORPUSCULAR HEMOGLOBIN CONC: 33 g/dL (ref 31.0–37.0)
MEAN CORPUSCULAR HEMOGLOBIN: 30.3 pg (ref 26.0–34.0)
MEAN CORPUSCULAR VOLUME: 91.8 fL (ref 80.0–100.0)
MEAN PLATELET VOLUME: 11.8 fL — ABNORMAL HIGH (ref 7.0–10.0)
MONOCYTES ABSOLUTE COUNT: 0.5 10*9/L (ref 0.2–0.8)
NEUTROPHILS ABSOLUTE COUNT: 6.4 10*9/L (ref 2.0–7.5)
PLATELET COUNT: 159 10*9/L (ref 150–440)
RED CELL DISTRIBUTION WIDTH: 16.7 % — ABNORMAL HIGH (ref 12.0–15.0)
WBC ADJUSTED: 8 10*9/L (ref 4.5–11.0)

## 2017-02-21 LAB — HEMOGLOBIN A1C: Hemoglobin A1c/Hemoglobin.total:MFr:Pt:Bld:Qn:: 5.2

## 2017-02-21 LAB — AST (SGOT): Aspartate aminotransferase:CCnc:Pt:Ser/Plas:Qn:: 25

## 2017-02-21 LAB — LIPID PANEL
CHOLESTEROL/HDL RATIO SCREEN: 2.4 (ref <5.0)
CHOLESTEROL: 117 mg/dL (ref 100–199)
TRIGLYCERIDES: 144 mg/dL (ref 1–149)
VLDL CHOLESTEROL CAL: 28.8 mg/dL (ref 11–41)

## 2017-02-21 LAB — HDL CHOLESTEROL: Cholesterol.in HDL:MCnc:Pt:Ser/Plas:Qn:: 48

## 2017-02-21 LAB — MEAN CORPUSCULAR HEMOGLOBIN CONC: Lab: 33

## 2017-02-21 LAB — CREATININE, URINE: Lab: 42

## 2017-02-21 LAB — ALBUMIN / CREATININE URINE RATIO: CREATININE, URINE: 42 mg/dL

## 2017-02-21 LAB — LACTATE DEHYDROGENASE: Lactate dehydrogenase:CCnc:Pt:Ser/Plas:Qn:: 541

## 2017-02-21 NOTE — Unmapped (Signed)
Lab drawn and sent for analysis.

## 2017-02-21 NOTE — Unmapped (Signed)
IDENTIFICATION: This is a 76 y.o. 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 She is s/p WBRT and had a PR on MRI initially. Thereafter, her MRI showed further improvement and she has remained in a CR. She had been slowly improving her functional capacity, but at her 5/16 visit, she was more weak and slurring her speech more. 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 No chemotherapy 2/2 her baseline PS. Non-chemotherapy options for recurrent PCNSL include: ibrutinib CR: 42% (Choquet, ASH, 2016; Grommes, ASCO, 2017), revlimid CR: 30% (Ghesquieres ASH 2016), or nivolumab ORR: 100% (Nayak, Blood, 2017). Ibrutinib has been the most thoroughly studied with one study evaluating 52 patients. Nivolumab has excellent responses, but the study only evaluated 5 patients.     Decided to treat with ibrutinib and she has completed 4 months of therapy. MRI 09/15/16 showed stable disease, and MRI on 11/15/16 also showed stable disease. Her CBC is relatively stable and she has no bleeding events. Of note, patient is on a baby ASA, which we will continue in setting of PAD - this is not a contraindication for ibrutinib. Patient is overall stable today. Will c/w ibrutinib 560mg  daily for the next month. Will plan for MRI every 2-3 months, due for next clinic visit. Of note, the patient wishes to be DNR/DNI - this was documented in the chart.    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     SUMMARY: 1. Pt clinically stable today  2. Cognitive issues likely 2/2 radiation, but will monitor closely  3. C/w Ibrutinib 560mg  daily  4. RTC 1 mo with MRI w wo contrast  5. Palliative care seeing patient locally.    Patient was seen and plan discussed with Dr. Cristal Deer Dittus    Wallis Bamberg, MD  Hematology/Oncology Fellow  ------------------------------------------------------    INTERVAL HISTORY:  Patient and son report that overall she is stable. She has weakness and cognitive impairment, especially with short term memory, but there have not been any major changes. Denies B-symptoms or new focal deficits. Had a fall about 1 month ago, it seems mechanical. Tolerating therapy well. Appetite is good. She is getting wound care for her chronic venous insufficiency.     Oncology History    -- 11/15/14: presented with weakness, aphasia, blurry vision.  MRI showed a left temporoparietal mass that crossed the spine of the corpus callosum. This was suspicious for lymphoma. 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 PET/CT without any evidence of systemic lymphoma   -- 11/20/14: started WBRT.  Chemotherapy was deferred due to underlying MDS and age.  The total radiation dose will be 5040 cGy.   -- 01/27/15: post WBRT MRI brain shows interval decrease in the size and enhancement of left posterior parietal periventricular lesion.  The lesion measures approximately 0.7 x 1.8 x 0.8 cm (previously 2.8 x 4.2  x 2.8 cm).         Primary CNS lymphoma (CMS-HCC)    11/10/2014 Initial Diagnosis     Primary CNS lymphoma (RAF-HCC)          REVIEW OF SYSTEMS:  See HPI. A 10 system ROS is otherwise negative.     PHYSICAL EXAMINATION:  VITAL SIGNS:   Vitals:    02/21/17 0918   BP: 141/65   Pulse: 65   Resp: 18   Temp: 36.3 ??C (97.4 ??F)   TempSrc: Oral   SpO2: 96%   Weight: 94.2 kg (207 lb 11.2 oz)   Height: 162.6 cm (5' 4)     EXAM:  ECOG: 2  GENERAL: +slurred speech.  AAOx3  HEENT: Pupils equal, round, and reactive to light.   NECK: Supple, no lymphadenopathy, no thyromegaly.  LUNGS: Clear to auscultation bilaterally.  HEART: Regular rate and rhythm. No R/G/M  ABDOMEN: Soft, nontender, nondistended.    EXTREMITIES: 1+ edema bilaterally   NEURO EXAM: Motor/sensory intact BL UE and LE. Cranial nerves II-XII are intact.   LYMPH NODES: No LAD    LABORATORY DATA:  Appointment on 02/21/2017   Component Date Value Ref Range Status   ??? Sodium 02/21/2017 144  135 - 145 mmol/L Final   ??? Potassium 02/21/2017 3.9  3.5 - 5.0 mmol/L Final   ??? Chloride 02/21/2017 104  98 - 107 mmol/L Final   ??? CO2 02/21/2017 29.0  22.0 - 30.0 mmol/L Final   ??? BUN 02/21/2017 16  7 - 21 mg/dL Final   ??? Creatinine 02/21/2017 0.81  0.60 - 1.00 mg/dL Final   ??? BUN/Creatinine Ratio 02/21/2017 20   Final   ??? EGFR MDRD Non Af Amer 02/21/2017 >=60  >=60 mL/min/1.50m2 Final   ??? EGFR MDRD Af Amer 02/21/2017 >=60  >=60 mL/min/1.8m2 Final   ??? Anion Gap 02/21/2017 11  9 - 15 mmol/L Final   ??? Glucose 02/21/2017 105  65 - 179 mg/dL Final   ??? Calcium 29/56/2130 9.1  8.5 - 10.2 mg/dL Final   ??? Albumin 86/57/8469 4.1  3.5 - 5.0 g/dL Final   ??? Total Protein 02/21/2017 6.8  6.5 - 8.3 g/dL Final   ??? Total Bilirubin 02/21/2017 1.4* 0.0 - 1.2 mg/dL Final   ??? AST 62/95/2841 25  14 - 38 U/L Final   ??? ALT 02/21/2017 30  15 - 48 U/L Final   ??? Alkaline Phosphatase 02/21/2017 54  38 - 126 U/L Final   ??? LDH 02/21/2017 541  338 - 610 U/L Final   ??? WBC 02/21/2017 8.0  4.5 - 11.0 10*9/L Final   ??? RBC 02/21/2017 3.60* 4.00 - 5.20 10*12/L Final   ??? HGB 02/21/2017 10.9* 12.0 - 16.0 g/dL Final   ??? HCT 32/44/0102 33.1* 36.0 - 46.0 % Final   ??? MCV 02/21/2017 91.8  80.0 - 100.0 fL Final   ??? MCH 02/21/2017 30.3  26.0 - 34.0 pg Final   ??? MCHC 02/21/2017 33.0  31.0 - 37.0 g/dL Final   ??? RDW 72/53/6644 16.7* 12.0 - 15.0 % Final   ??? MPV 02/21/2017 11.8* 7.0 - 10.0 fL Final   ??? Platelet 02/21/2017 159  150 - 440 10*9/L Final   ??? Variable HGB Concentration 02/21/2017 Slight* Not Present Final   ??? Absolute Neutrophils 02/21/2017 6.4  2.0 - 7.5 10*9/L Final   ??? Absolute Lymphocytes 02/21/2017 0.7* 1.5 - 5.0 10*9/L Final   ??? Absolute Monocytes 02/21/2017 0.5  0.2 - 0.8  10*9/L Final   ??? Absolute Eosinophils 02/21/2017 0.2  0.0 - 0.4 10*9/L Final   ??? Absolute Basophils 02/21/2017 0.1  0.0 - 0.1 10*9/L Final   ??? Large Unstained Cells 02/21/2017 1  0 - 4 % Final   ??? Macrocytosis 02/21/2017 Slight* Not Present Final   ??? Anisocytosis 02/21/2017 Slight* Not Present Final   ??? Hypochromasia 02/21/2017 Slight* Not Present Final   ??? Hemoglobin A1C 02/21/2017 5.2  4.8 - 5.6 % Final   ??? Estimated Average Glucose 02/21/2017 103  mg/dL Final   ??? Triglycerides 02/21/2017 144  1 - 149 mg/dL Final   ??? Cholesterol 02/21/2017 117  100 - 199 mg/dL Final   ??? HDL 16/12/9602 48  40 - 59 mg/dL Final   ??? LDL Calculated 02/21/2017 40* 60 - 99 mg/dL Final      NHLBI Recommended Ranges, LDL Cholesterol, for Adults (20+yrs) (ATPIII), mg/dL  Optimal              <540  Near Optimal        100-129  Borderline High     130-159  High                160-189  Very High            >=190  NHLBI Recommended Ranges, LDL Cholesterol, for Children (2-19 yrs), mg/dL  Desirable            <981  Borderline High     110-129  High                 >=130     ??? VLDL Cholesterol Cal 02/21/2017 28.8  11 - 41 mg/dL Final   ??? Chol/HDL Ratio 02/21/2017 2.4  <1.9 Final   ??? Non-HDL Cholesterol 02/21/2017 69  mg/dL Final      Non-HDL Cholesterol Recommended Ranges (mg/dL)  Optimal       <147  Near Optimal 130 - 159  Borderline High 160 - 189  High             190 - 219  Very High       >220     ??? FASTING 02/21/2017 Unknown   Final   ??? Creat U 02/21/2017 42.0  Undefined mg/dL Final   ??? Albumin Quantitative, Urine 02/21/2017 <0.6  mg/dL Final   ??? Albumin/Creatinine Ratio 02/21/2017   0.0 - 30.0 ug/mg Final      Unable to calculate due to value below lower limit of assay linearity.     IMAGING STUDIES:  MRI Brain (01/08/17):  -Stable foci of enhancement in the splenium and along the superior aspect of the right lateral ventricle. No other sites of disease.  -Chronic severe mucosal thickening.    MRI Brain (11/15/16):  Unchanged linear enhancement in the region of the splenium of the corpus callosum. No new enhancing lesions.    MRI Brain (09/20/16):  Stable linear enhancement along the body and splenium of the corpus callosum, right greater than left.    MRI Brain (08/09/16):  Interval recurrence of linear enhancement along the body and splenium of the corpus callosum, right greater than left and suspicion of recurrence.    MRI Brain (02/04/16):  Stable exam without evidence of recurrent disease.    MRI Brain (11/03/15):  No evidence of residual enhancement in the splenium of the corpus callosum. No new disease.  Similar appearance of right maxillary sinusitis with fungal component or inspissated secretions.    MRI  Brain (08/04/15):  Stable, minimal residual enhancement in the splenium of the corpus callosum. No new disease.    MRI Brain 01/27/15: Post Tx WBRT MRI.  - Interval decrease in the size and enhancement of left posterior parietal periventricular lesion involving splenium of the corpus callosum demostrating decreased enhancement and reduced perilesional vasogenic edema. parietal mass with corpus callosal involvement, consistent with response to whole brain radiation therapy.    PATHOLOGY REPORTS:  11/13/14: Cerebrospinal fluid:  - Involved by monoclonal B-cell population (11% of lymphocytes)  - Review of the cytospin reveals a few large abnormal appearing mononuclear cells and a limited flow cytometry panel is positive for a monoclonal B-cell population. 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

## 2017-02-21 NOTE — Unmapped (Addendum)
Appointment on 02/21/2017   Component Date Value Ref Range Status   ??? Sodium 02/21/2017 144  135 - 145 mmol/L Final   ??? Potassium 02/21/2017 3.9  3.5 - 5.0 mmol/L Final   ??? Chloride 02/21/2017 104  98 - 107 mmol/L Final   ??? CO2 02/21/2017 29.0  22.0 - 30.0 mmol/L Final   ??? BUN 02/21/2017 16  7 - 21 mg/dL Final   ??? Creatinine 02/21/2017 0.81  0.60 - 1.00 mg/dL Final   ??? BUN/Creatinine Ratio 02/21/2017 20   Final   ??? EGFR MDRD Non Af Amer 02/21/2017 >=60  >=60 mL/min/1.3m2 Final   ??? EGFR MDRD Af Amer 02/21/2017 >=60  >=60 mL/min/1.16m2 Final   ??? Anion Gap 02/21/2017 11  9 - 15 mmol/L Final   ??? Glucose 02/21/2017 105  65 - 179 mg/dL Final   ??? Calcium 14/78/2956 9.1  8.5 - 10.2 mg/dL Final   ??? Albumin 21/30/8657 4.1  3.5 - 5.0 g/dL Final   ??? Total Protein 02/21/2017 6.8  6.5 - 8.3 g/dL Final   ??? Total Bilirubin 02/21/2017 1.4* 0.0 - 1.2 mg/dL Final   ??? AST 84/69/6295 25  14 - 38 U/L Final   ??? ALT 02/21/2017 30  15 - 48 U/L Final   ??? Alkaline Phosphatase 02/21/2017 54  38 - 126 U/L Final   ??? LDH 02/21/2017 541  338 - 610 U/L Final   ??? WBC 02/21/2017 8.0  4.5 - 11.0 10*9/L Final   ??? RBC 02/21/2017 3.60* 4.00 - 5.20 10*12/L Final   ??? HGB 02/21/2017 10.9* 12.0 - 16.0 g/dL Final   ??? HCT 28/41/3244 33.1* 36.0 - 46.0 % Final   ??? MCV 02/21/2017 91.8  80.0 - 100.0 fL Final   ??? MCH 02/21/2017 30.3  26.0 - 34.0 pg Final   ??? MCHC 02/21/2017 33.0  31.0 - 37.0 g/dL Final   ??? RDW 03/29/7251 16.7* 12.0 - 15.0 % Final   ??? MPV 02/21/2017 11.8* 7.0 - 10.0 fL Final   ??? Platelet 02/21/2017 159  150 - 440 10*9/L Final   ??? Variable HGB Concentration 02/21/2017 Slight* Not Present Final   ??? Absolute Neutrophils 02/21/2017 6.4  2.0 - 7.5 10*9/L Final   ??? Absolute Lymphocytes 02/21/2017 0.7* 1.5 - 5.0 10*9/L Final   ??? Absolute Monocytes 02/21/2017 0.5  0.2 - 0.8 10*9/L Final   ??? Absolute Eosinophils 02/21/2017 0.2  0.0 - 0.4 10*9/L Final   ??? Absolute Basophils 02/21/2017 0.1  0.0 - 0.1 10*9/L Final   ??? Large Unstained Cells 02/21/2017 1  0 - 4 % Final   ??? Macrocytosis 02/21/2017 Slight* Not Present Final   ??? Anisocytosis 02/21/2017 Slight* Not Present Final   ??? Hypochromasia 02/21/2017 Slight* Not Present Final   ??? Hemoglobin A1C 02/21/2017 5.2  4.8 - 5.6 % Final   ??? Estimated Average Glucose 02/21/2017 103  mg/dL Final   ??? Triglycerides 02/21/2017 144  1 - 149 mg/dL Final   ??? Cholesterol 02/21/2017 117  100 - 199 mg/dL Final   ??? HDL 66/44/0347 48  40 - 59 mg/dL Final   ??? LDL Calculated 02/21/2017 40* 60 - 99 mg/dL Final      NHLBI Recommended Ranges, LDL Cholesterol, for Adults (20+yrs) (ATPIII), mg/dL  Optimal              <425  Near Optimal        100-129  Borderline High     130-159  High  160-189  Very High            >=190  NHLBI Recommended Ranges, LDL Cholesterol, for Children (2-19 yrs), mg/dL  Desirable            <409  Borderline High     110-129  High                 >=130     ??? VLDL Cholesterol Cal 02/21/2017 28.8  11 - 41 mg/dL Final   ??? Chol/HDL Ratio 02/21/2017 2.4  <8.1 Final   ??? Non-HDL Cholesterol 02/21/2017 69  mg/dL Final      Non-HDL Cholesterol Recommended Ranges (mg/dL)  Optimal       <191  Near Optimal 130 - 159  Borderline High 160 - 189  High             190 - 219  Very High       >220     ??? FASTING 02/21/2017 Unknown   Final   ??? Creat U 02/21/2017 42.0  Undefined mg/dL Final   ??? Albumin Quantitative, Urine 02/21/2017 <0.6  mg/dL Final   ??? Albumin/Creatinine Ratio 02/21/2017   0.0 - 30.0 ug/mg Final      Unable to calculate due to value below lower limit of assay linearity.       Please call us if you experience:   1. Nausea or vomiting not controlled by nausea medicines  2. Fever of 100.5 F or higher   3. Uncontrolled pain  4. Any other concerning symptom     For health related questions call: Please call the office at [phone] and ask for Margaretmary Lombard, our nurse navigator.  For appointment changes call: Main Clinic [phone]  After hours call [place] Operator and ask for the Oncology Fellow on call: [phone]    Dr. Thayer Ohm Dittus  [facility] Hematology/Oncology

## 2017-02-27 NOTE — Unmapped (Signed)
[facility] Refill Coordination Note  Specialty Medication(s): Imbruvica 560mg       Carolyn Andersen, DOB: 07/14/1940  Phone: [phone] (home) , Alternate phone contact: N/A  Phone or address changes today?: No  All above HIPAA information was verified with patient.  Shipping Address: [address]  [place] [address]   Insurance changes? No    Completed refill call assessment today to schedule patient's medication shipment from the [facility] [phone]).      Confirmed the medication and dosage are correct and have not changed: Yes, regimen is correct and unchanged.    Confirmed patient started or stopped the following medications in the past month:  Yes. Carolyn Andersen reports starting the following medications: Ciprofloxacin for UTI, Laxis for swelling    Are you tolerating your medication?:  Carolyn Andersen reports tolerating the medication.    ADHERENCE    Is this medicine transplant or covered by Medicare Part B? No.        Did you miss any doses in the past 4 weeks? No missed doses reported.    FINANCIAL/SHIPPING    Delivery Scheduled: Yes, Expected medication delivery date: 03/01/17     Carolyn Andersen did not have any additional questions at this time.    Delivery address validated in [facility]SI scheduling system: Yes, address listed in FSI is correct.    We will follow up with patient monthly for standard refill processing and delivery.      Thank you,  [employer]   [facility] Specialty Pharmacist

## 2017-03-02 MED FILL — IMBRUVICA/560MG/TAB: IMBRUVICA/560MG/TAB | 28 days supply | Qty: 28 | Fill #0

## 2017-03-22 NOTE — Unmapped (Signed)
[facility] Refill Coordination Note  Specialty Medication(s): Imbruvica 560mg       JOHANN GASCOIGNE, DOB: [birth year]  Phone: [phone] (home) , Alternate phone contact: N/A  Phone or address changes today?: No  All above HIPAA information was verified with patient.  Shipping Address: [address]  [place] [address]   Insurance changes? No    Completed refill call assessment today to schedule patient's medication shipment from the [facility] [phone]).      Confirmed the medication and dosage are correct and have not changed: Yes, regimen is correct and unchanged.    Confirmed patient started or stopped the following medications in the past month:  No, there are no changes reported at this time.    Are you tolerating your medication?:  Dajahnae reports tolerating the medication.    ADHERENCE    Is this medicine transplant or covered by Medicare Part B? No.        Did you miss any doses in the past 4 weeks? No missed doses reported.    FINANCIAL/SHIPPING    Delivery Scheduled: Yes, Expected medication delivery date: 03/29/17     Timmie did not have any additional questions at this time.    Delivery address validated in FSI scheduling system: Yes, address listed in FSI is correct.    We will follow up with patient monthly for standard refill processing and delivery.      Thank you,  [employer]   [facility] Specialty Pharmacist

## 2017-03-28 ENCOUNTER — Other Ambulatory Visit: Admit: 2017-03-28 | Discharge: 2017-03-28 | Payer: MEDICARE

## 2017-03-28 ENCOUNTER — Ambulatory Visit: Admit: 2017-03-28 | Discharge: 2017-03-28 | Payer: MEDICARE

## 2017-03-28 DIAGNOSIS — C8589 Other specified types of non-Hodgkin lymphoma, extranodal and solid organ sites: Principal | ICD-10-CM

## 2017-03-28 LAB — CBC W/ AUTO DIFF
BASOPHILS ABSOLUTE COUNT: 0.1 10*9/L (ref 0.0–0.1)
EOSINOPHILS ABSOLUTE COUNT: 0.2 10*9/L (ref 0.0–0.4)
HEMATOCRIT: 31.4 % — ABNORMAL LOW (ref 36.0–46.0)
HEMOGLOBIN: 10.8 g/dL — ABNORMAL LOW (ref 12.0–16.0)
LARGE UNSTAINED CELLS: 1 % (ref 0–4)
LYMPHOCYTES ABSOLUTE COUNT: 0.8 10*9/L — ABNORMAL LOW (ref 1.5–5.0)
MEAN CORPUSCULAR HEMOGLOBIN CONC: 34.3 g/dL (ref 31.0–37.0)
MEAN CORPUSCULAR HEMOGLOBIN: 30.9 pg (ref 26.0–34.0)
MEAN PLATELET VOLUME: 10.8 fL — ABNORMAL HIGH (ref 7.0–10.0)
MONOCYTES ABSOLUTE COUNT: 0.5 10*9/L (ref 0.2–0.8)
NEUTROPHILS ABSOLUTE COUNT: 6.2 10*9/L (ref 2.0–7.5)
PLATELET COUNT: 147 10*9/L — ABNORMAL LOW (ref 150–440)
RED BLOOD CELL COUNT: 3.49 10*12/L — ABNORMAL LOW (ref 4.00–5.20)
WBC ADJUSTED: 7.7 10*9/L (ref 4.5–11.0)

## 2017-03-28 LAB — COMPREHENSIVE METABOLIC PANEL
ALBUMIN: 4.4 g/dL (ref 3.5–5.0)
ALT (SGPT): 30 U/L (ref 15–48)
ANION GAP: 12 mmol/L (ref 9–15)
AST (SGOT): 27 U/L (ref 14–38)
BILIRUBIN TOTAL: 2.5 mg/dL — ABNORMAL HIGH (ref 0.0–1.2)
BLOOD UREA NITROGEN: 16 mg/dL (ref 7–21)
BUN / CREAT RATIO: 20
CALCIUM: 9.3 mg/dL (ref 8.5–10.2)
CHLORIDE: 102 mmol/L (ref 98–107)
CO2: 30 mmol/L (ref 22.0–30.0)
CREATININE: 0.81 mg/dL (ref 0.60–1.00)
EGFR MDRD AF AMER: >=60 mL/min/{1.73_m2} (ref >=60)
EGFR MDRD NON AF AMER: >=60 mL/min/{1.73_m2} (ref >=60)
GLUCOSE RANDOM: 114 mg/dL (ref 65–179)
POTASSIUM: 3.9 mmol/L (ref 3.5–5.0)
PROTEIN TOTAL: 6.7 g/dL (ref 6.5–8.3)
SODIUM: 144 mmol/L (ref 135–145)

## 2017-03-28 LAB — BUN / CREAT RATIO: Urea nitrogen/Creatinine:MRto:Pt:Ser/Plas:Qn:: 20

## 2017-03-28 LAB — RED CELL DISTRIBUTION WIDTH: Lab: 16.8 — ABNORMAL HIGH

## 2017-03-28 LAB — LACTATE DEHYDROGENASE: Lactate dehydrogenase:CCnc:Pt:Ser/Plas:Qn:: 629 — ABNORMAL HIGH

## 2017-03-28 MED FILL — IMBRUVICA/560MG/TAB: IMBRUVICA/560MG/TAB | 28 days supply | Qty: 28 | Fill #1

## 2017-03-28 NOTE — Unmapped (Signed)
Lab on 03/28/2017   Component Date Value Ref Range Status   ??? Sodium 03/28/2017 144  135 - 145 mmol/L Final   ??? Potassium 03/28/2017 3.9  3.5 - 5.0 mmol/L Final   ??? Chloride 03/28/2017 102  98 - 107 mmol/L Final   ??? CO2 03/28/2017 30.0  22.0 - 30.0 mmol/L Final   ??? BUN 03/28/2017 16  7 - 21 mg/dL Final   ??? Creatinine 03/28/2017 0.81  0.60 - 1.00 mg/dL Final   ??? BUN/Creatinine Ratio 03/28/2017 20   Final   ??? EGFR MDRD Non Af Amer 03/28/2017 >=60  >=60 mL/min/1.108m2 Final   ??? EGFR MDRD Af Amer 03/28/2017 >=60  >=60 mL/min/1.72m2 Final   ??? Anion Gap 03/28/2017 12  9 - 15 mmol/L Final   ??? Glucose 03/28/2017 114  65 - 179 mg/dL Final   ??? Calcium 16/12/9602 9.3  8.5 - 10.2 mg/dL Final   ??? Albumin 54/11/8117 4.4  3.5 - 5.0 g/dL Final   ??? Total Protein 03/28/2017 6.7  6.5 - 8.3 g/dL Final   ??? Total Bilirubin 03/28/2017 2.5* 0.0 - 1.2 mg/dL Final   ??? AST 14/78/2956 27  14 - 38 U/L Final   ??? ALT 03/28/2017 30  15 - 48 U/L Final   ??? Alkaline Phosphatase 03/28/2017 59  38 - 126 U/L Final   ??? LDH 03/28/2017 629* 338 - 610 U/L Final   ??? WBC 03/28/2017 7.7  4.5 - 11.0 10*9/L Final   ??? RBC 03/28/2017 3.49* 4.00 - 5.20 10*12/L Final   ??? HGB 03/28/2017 10.8* 12.0 - 16.0 g/dL Final   ??? HCT 21/30/8657 31.4* 36.0 - 46.0 % Final   ??? MCV 03/28/2017 90.1  80.0 - 100.0 fL Final   ??? MCH 03/28/2017 30.9  26.0 - 34.0 pg Final   ??? MCHC 03/28/2017 34.3  31.0 - 37.0 g/dL Final   ??? RDW 84/69/6295 16.8* 12.0 - 15.0 % Final   ??? MPV 03/28/2017 10.8* 7.0 - 10.0 fL Final   ??? Platelet 03/28/2017 147* 150 - 440 10*9/L Final   ??? Variable HGB Concentration 03/28/2017 Slight* Not Present Final   ??? Absolute Neutrophils 03/28/2017 6.2  2.0 - 7.5 10*9/L Final   ??? Absolute Lymphocytes 03/28/2017 0.8* 1.5 - 5.0 10*9/L Final   ??? Absolute Monocytes 03/28/2017 0.5  0.2 - 0.8 10*9/L Final   ??? Absolute Eosinophils 03/28/2017 0.2  0.0 - 0.4 10*9/L Final   ??? Absolute Basophils 03/28/2017 0.1  0.0 - 0.1 10*9/L Final   ??? Large Unstained Cells 03/28/2017 1  0 - 4 % Final ??? Anisocytosis 03/28/2017 Slight* Not Present Final   ??? Hypochromasia 03/28/2017 Slight* Not Present Final       Please call us if you experience:   1. Nausea or vomiting not controlled by nausea medicines  2. Fever of 100.5 F or higher   3. Uncontrolled pain  4. Any other concerning symptom     For health related questions call: Please call the office at [phone] and ask to speak with triage line.  For appointment changes call: Main Clinic [phone]  After hours call [place] Operator and ask for the Oncology Fellow on call: [phone]    Ivor Costa. Thera Flake, PA-C    Dr. Thayer Ohm Dittus  [facility] Hematology/Oncology

## 2017-03-28 NOTE — Unmapped (Signed)
Lab drawn and sent for analysis.

## 2017-04-04 NOTE — Unmapped (Signed)
IDENTIFICATION: This is a 77 y.o. 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 She is s/p WBRT and had a PR on MRI initially. Thereafter, her MRI showed further improvement and she has remained in a CR. She had been slowly improving her functional capacity, but at her 5/16 visit, she was more weak and slurring her speech more. 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 No chemotherapy 2/2 her baseline PS. Non-chemotherapy options for recurrent PCNSL include: ibrutinib CR: 42% (Choquet, ASH, 2016; Grommes, ASCO, 2017), revlimid CR: 30% (Ghesquieres ASH 2016), or nivolumab ORR: 100% (Nayak, Blood, 2017). Ibrutinib has been the most thoroughly studied with one study evaluating 52 patients. Nivolumab has excellent responses, but the study only evaluated 5 patients.     Decided to treat with ibrutinib and she has completed 5 months of therapy. MRI 09/15/16 showed stable disease, and MRI on 11/15/16 also showed stable disease. Her CBC is relatively stable and she has no bleeding events. Of note, patient is on a baby ASA, which we will continue in setting of PAD - this is not a contraindication for ibrutinib. Patient is overall stable today. Will c/w ibrutinib 560mg  daily for the next month. Will plan for MRI every 2-3 months. MRI 03/2017 shows stable disease. Of note, the patient wishes to be DNR/DNI - this was documented in the chart.    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 SUMMARY:  1. Pt clinically stable today  2. Cognitive issues likely 2/2 radiation, but will monitor closely  3. C/w Ibrutinib 560mg  daily  4. RTC 1 mo  5. Palliative care seeing patient locally.    Markesia Crilly K. Thera Flake, PA-C  ------------------------------------------------------    INTERVAL HISTORY:  Patient and son report that they have seen some decline in the past couple weeks. Her weakness is progressing and she is having difficulty getting around without her wheelchair. Also requires assistance getting up from the toilet. Also having difficulties with names at time and her attention has been worse. They do feel that there has been a change in the past 2 weeks that is steeper than her previous decline. Denies B-symptoms or new focal deficits. Tolerating therapy well. Appetite is good. She is getting wound care for her chronic venous insufficiency.     Oncology History    -- 11/15/14: presented with weakness, aphasia, blurry vision.  MRI showed a left temporoparietal mass that crossed the spine of the corpus callosum. This was suspicious for lymphoma. 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 PET/CT without any evidence of systemic lymphoma   -- 11/20/14: started WBRT.  Chemotherapy was deferred due to underlying MDS and age.  The total radiation dose will be 5040 cGy.   -- 01/27/15: post WBRT MRI brain shows interval decrease in the size and enhancement of left posterior parietal periventricular lesion.  The lesion measures approximately 0.7  x 1.8 x 0.8 cm (previously 2.8 x 4.2 x 2.8 cm).         Primary CNS lymphoma (CMS-HCC)    11/10/2014 Initial Diagnosis     Primary CNS lymphoma (RAF-HCC)          REVIEW OF SYSTEMS:  See HPI. A 10 system ROS is otherwise negative.     PHYSICAL EXAMINATION:  VITAL SIGNS:   Vitals:    03/28/17 1351   BP: 153/57   Pulse: 74   Resp: 18   Temp: 36.8 ??C (98.3 ??F)   TempSrc: Oral   SpO2: 100%   Weight: 95.8 kg (211 lb 3.2 oz)   Height: 162.6 cm (5' 4.02)     EXAM:  ECOG: 2  GENERAL: +slurred speech.  AAOx3  HEENT: Pupils equal, round, and reactive to light.   NECK: Supple, no lymphadenopathy, no thyromegaly.  LUNGS: Clear to auscultation bilaterally.  HEART: Regular rate and rhythm. No R/G/M  ABDOMEN: Soft, nontender, nondistended.    EXTREMITIES: 1+ edema bilaterally   NEURO EXAM: Motor/sensory intact BL UE and LE. Cranial nerves II-XII are intact.   LYMPH NODES: No LAD    LABORATORY DATA:  Lab on 03/28/2017   Component Date Value Ref Range Status   ??? Sodium 03/28/2017 144  135 - 145 mmol/L Final   ??? Potassium 03/28/2017 3.9  3.5 - 5.0 mmol/L Final   ??? Chloride 03/28/2017 102  98 - 107 mmol/L Final   ??? CO2 03/28/2017 30.0  22.0 - 30.0 mmol/L Final   ??? BUN 03/28/2017 16  7 - 21 mg/dL Final   ??? Creatinine 03/28/2017 0.81  0.60 - 1.00 mg/dL Final   ??? BUN/Creatinine Ratio 03/28/2017 20   Final   ??? EGFR MDRD Non Af Amer 03/28/2017 >=60  >=60 mL/min/1.1m2 Final   ??? EGFR MDRD Af Amer 03/28/2017 >=60  >=60 mL/min/1.67m2 Final   ??? Anion Gap 03/28/2017 12  9 - 15 mmol/L Final   ??? Glucose 03/28/2017 114  65 - 179 mg/dL Final   ??? Calcium 16/12/9602 9.3  8.5 - 10.2 mg/dL Final   ??? Albumin 54/11/8117 4.4  3.5 - 5.0 g/dL Final   ??? Total Protein 03/28/2017 6.7  6.5 - 8.3 g/dL Final   ??? Total Bilirubin 03/28/2017 2.5* 0.0 - 1.2 mg/dL Final   ??? AST 14/78/2956 27  14 - 38 U/L Final   ??? ALT 03/28/2017 30  15 - 48 U/L Final   ??? Alkaline Phosphatase 03/28/2017 59  38 - 126 U/L Final   ??? LDH 03/28/2017 629* 338 - 610 U/L Final   ??? WBC 03/28/2017 7.7  4.5 - 11.0 10*9/L Final   ??? RBC 03/28/2017 3.49* 4.00 - 5.20 10*12/L Final   ??? HGB 03/28/2017 10.8* 12.0 - 16.0 g/dL Final   ??? HCT 21/30/8657 31.4* 36.0 - 46.0 % Final   ??? MCV 03/28/2017 90.1  80.0 - 100.0 fL Final   ??? MCH 03/28/2017 30.9  26.0 - 34.0 pg Final   ??? MCHC 03/28/2017 34.3  31.0 - 37.0 g/dL Final   ??? RDW 84/69/6295 16.8* 12.0 - 15.0 % Final   ??? MPV 03/28/2017 10.8* 7.0 - 10.0 fL Final   ??? Platelet 03/28/2017 147* 150 - 440 10*9/L Final   ??? Variable HGB Concentration 03/28/2017 Slight* Not Present Final   ??? Absolute Neutrophils 03/28/2017 6.2  2.0 - 7.5 10*9/L Final   ??? Absolute Lymphocytes 03/28/2017 0.8* 1.5 - 5.0 10*9/L Final   ??? Absolute  Monocytes 03/28/2017 0.5  0.2 - 0.8 10*9/L Final   ??? Absolute Eosinophils 03/28/2017 0.2  0.0 - 0.4 10*9/L Final   ??? Absolute Basophils 03/28/2017 0.1  0.0 - 0.1 10*9/L Final   ??? Large Unstained Cells 03/28/2017 1  0 - 4 % Final   ??? Anisocytosis 03/28/2017 Slight* Not Present Final   ??? Hypochromasia 03/28/2017 Slight* Not Present Final     IMAGING STUDIES:  MRI Brain (01/08/17):  -Stable foci of enhancement in the splenium and along the superior aspect of the right lateral ventricle. No other sites of disease.  -Chronic severe mucosal thickening.    MRI Brain (11/15/16):  Unchanged linear enhancement in the region of the splenium of the corpus callosum. No new enhancing lesions.    MRI Brain (09/20/16):  Stable linear enhancement along the body and splenium of the corpus callosum, right greater than left.    MRI Brain (08/09/16):  Interval recurrence of linear enhancement along the body and splenium of the corpus callosum, right greater than left and suspicion of recurrence.    MRI Brain (02/04/16):  Stable exam without evidence of recurrent disease.    MRI Brain (11/03/15):  No evidence of residual enhancement in the splenium of the corpus callosum. No new disease.  Similar appearance of right maxillary sinusitis with fungal component or inspissated secretions.    MRI Brain (08/04/15):  Stable, minimal residual enhancement in the splenium of the corpus callosum. No new disease.    MRI Brain 01/27/15: Post Tx WBRT MRI.  - Interval decrease in the size and enhancement of left posterior parietal periventricular lesion involving splenium of the corpus callosum demostrating decreased enhancement and reduced perilesional vasogenic edema. parietal mass with corpus callosal involvement, consistent with response to whole brain radiation therapy.    PATHOLOGY REPORTS:  11/13/14: Cerebrospinal fluid:  - Involved by monoclonal B-cell population (11% of lymphocytes)  - Review of the cytospin reveals a few large abnormal appearing mononuclear cells and a limited flow cytometry panel is positive for a monoclonal B-cell population. 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

## 2017-04-23 MED FILL — IMBRUVICA/560MG/TAB: IMBRUVICA/560MG/TAB | 28 days supply | Qty: 28 | Fill #2

## 2017-04-23 NOTE — Unmapped (Signed)
[facility] Refill Coordination Note  Specialty Medication(s): Imbruvica 560mg       Carolyn Andersen, DOB: 1941-01-18  Phone: [phone] (home) , Alternate phone contact: N/A  Phone or address changes today?: No  All above HIPAA information was verified with patient.  Shipping Address: [address]  [place] [address]   Insurance changes? No    Completed refill call assessment today to schedule patient's medication shipment from the [facility] [phone]).      Confirmed the medication and dosage are correct and have not changed: Yes, regimen is correct and unchanged.    Confirmed patient started or stopped the following medications in the past month:  No, there are no changes reported at this time.    Are you tolerating your medication?:  Carolyn Andersen reports tolerating the medication.    ADHERENCE    Is this medicine transplant or covered by Medicare Part B? No.        Did you miss any doses in the past 4 weeks? No missed doses reported.    FINANCIAL/SHIPPING    Delivery Scheduled: Yes, Expected medication delivery date: 04/25/17     Carolyn Andersen did not have any additional questions at this time.    Delivery address validated in FSI scheduling system: Yes, address listed in FSI is correct.    We will follow up with patient monthly for standard refill processing and delivery.      Thank you,  [employer]   [facility] Specialty Pharmacist

## 2017-05-02 ENCOUNTER — Ambulatory Visit: Admit: 2017-05-02 | Discharge: 2017-05-03 | Payer: MEDICARE

## 2017-05-02 ENCOUNTER — Other Ambulatory Visit: Admit: 2017-05-02 | Discharge: 2017-05-03 | Payer: MEDICARE

## 2017-05-02 DIAGNOSIS — C8589 Other specified types of non-Hodgkin lymphoma, extranodal and solid organ sites: Principal | ICD-10-CM

## 2017-05-02 LAB — CBC W/ AUTO DIFF
BASOPHILS ABSOLUTE COUNT: 0 10*9/L (ref 0.0–0.1)
EOSINOPHILS ABSOLUTE COUNT: 0.1 10*9/L (ref 0.0–0.4)
HEMATOCRIT: 29.7 % — ABNORMAL LOW (ref 36.0–46.0)
HEMOGLOBIN: 10 g/dL — ABNORMAL LOW (ref 12.0–16.0)
LARGE UNSTAINED CELLS: 1 % (ref 0–4)
MEAN CORPUSCULAR HEMOGLOBIN CONC: 33.8 g/dL (ref 31.0–37.0)
MEAN CORPUSCULAR HEMOGLOBIN: 30.7 pg (ref 26.0–34.0)
MEAN CORPUSCULAR VOLUME: 90.9 fL (ref 80.0–100.0)
MEAN PLATELET VOLUME: 11.2 fL — ABNORMAL HIGH (ref 7.0–10.0)
MONOCYTES ABSOLUTE COUNT: 0.4 10*9/L (ref 0.2–0.8)
NEUTROPHILS ABSOLUTE COUNT: 4.3 10*9/L (ref 2.0–7.5)
PLATELET COUNT: 136 10*9/L — ABNORMAL LOW (ref 150–440)
RED BLOOD CELL COUNT: 3.26 10*12/L — ABNORMAL LOW (ref 4.00–5.20)
RED CELL DISTRIBUTION WIDTH: 17 % — ABNORMAL HIGH (ref 12.0–15.0)
WBC ADJUSTED: 5.5 10*9/L (ref 4.5–11.0)

## 2017-05-02 LAB — COMPREHENSIVE METABOLIC PANEL
ALBUMIN: 4.1 g/dL (ref 3.5–5.0)
ALKALINE PHOSPHATASE: 51 U/L (ref 38–126)
ANION GAP: 10 mmol/L (ref 9–15)
AST (SGOT): 28 U/L (ref 14–38)
BILIRUBIN TOTAL: 1.9 mg/dL — ABNORMAL HIGH (ref 0.0–1.2)
BLOOD UREA NITROGEN: 16 mg/dL (ref 7–21)
BUN / CREAT RATIO: 21
CALCIUM: 9.1 mg/dL (ref 8.5–10.2)
CHLORIDE: 105 mmol/L (ref 98–107)
CO2: 27 mmol/L (ref 22.0–30.0)
CREATININE: 0.75 mg/dL (ref 0.60–1.00)
EGFR MDRD AF AMER: >=60 mL/min/{1.73_m2} (ref >=60)
EGFR MDRD NON AF AMER: >=60 mL/min/{1.73_m2} (ref >=60)
POTASSIUM: 4 mmol/L (ref 3.5–5.0)
PROTEIN TOTAL: 6.1 g/dL — ABNORMAL LOW (ref 6.5–8.3)
SODIUM: 142 mmol/L (ref 135–145)

## 2017-05-02 LAB — LACTATE DEHYDROGENASE: Lactate dehydrogenase:CCnc:Pt:Ser/Plas:Qn:: 485

## 2017-05-02 LAB — HYPOCHROMIA

## 2017-05-02 LAB — SODIUM: Sodium:SCnc:Pt:Ser/Plas:Qn:: 142

## 2017-05-02 NOTE — Unmapped (Signed)
Labs drawn per butterfly. No issues

## 2017-05-02 NOTE — Unmapped (Signed)
Labwork:  Lab on 05/02/2017   Component Date Value Ref Range Status   ??? Sodium 05/02/2017 142  135 - 145 mmol/L Final   ??? Potassium 05/02/2017 4.0  3.5 - 5.0 mmol/L Final   ??? Chloride 05/02/2017 105  98 - 107 mmol/L Final   ??? CO2 05/02/2017 27.0  22.0 - 30.0 mmol/L Final   ??? BUN 05/02/2017 16  7 - 21 mg/dL Final   ??? Creatinine 05/02/2017 0.75  0.60 - 1.00 mg/dL Final   ??? BUN/Creatinine Ratio 05/02/2017 21   Final   ??? EGFR MDRD Non Af Amer 05/02/2017 >=60  >=60 mL/min/1.49m2 Final   ??? EGFR MDRD Af Amer 05/02/2017 >=60  >=60 mL/min/1.53m2 Final   ??? Anion Gap 05/02/2017 10  9 - 15 mmol/L Final   ??? Glucose 05/02/2017 114  65 - 179 mg/dL Final   ??? Calcium 13/10/6576 9.1  8.5 - 10.2 mg/dL Final   ??? Albumin 46/96/2952 4.1  3.5 - 5.0 g/dL Final   ??? Total Protein 05/02/2017 6.1* 6.5 - 8.3 g/dL Final   ??? Total Bilirubin 05/02/2017 1.9* 0.0 - 1.2 mg/dL Final   ??? AST 84/13/2440 28  14 - 38 U/L Final   ??? ALT 05/02/2017 35  15 - 48 U/L Final   ??? Alkaline Phosphatase 05/02/2017 51  38 - 126 U/L Final   ??? LDH 05/02/2017 485  338 - 610 U/L Final   ??? WBC 05/02/2017 5.5  4.5 - 11.0 10*9/L Final   ??? RBC 05/02/2017 3.26* 4.00 - 5.20 10*12/L Final   ??? HGB 05/02/2017 10.0* 12.0 - 16.0 g/dL Final   ??? HCT 01/21/2535 29.7* 36.0 - 46.0 % Final   ??? MCV 05/02/2017 90.9  80.0 - 100.0 fL Final   ??? MCH 05/02/2017 30.7  26.0 - 34.0 pg Final   ??? MCHC 05/02/2017 33.8  31.0 - 37.0 g/dL Final   ??? RDW 64/40/3474 17.0* 12.0 - 15.0 % Final   ??? MPV 05/02/2017 11.2* 7.0 - 10.0 fL Final   ??? Platelet 05/02/2017 136* 150 - 440 10*9/L Final   ??? Variable HGB Concentration 05/02/2017 Slight* Not Present Final   ??? Absolute Neutrophils 05/02/2017 4.3  2.0 - 7.5 10*9/L Final   ??? Absolute Lymphocytes 05/02/2017 0.6* 1.5 - 5.0 10*9/L Final   ??? Absolute Monocytes 05/02/2017 0.4  0.2 - 0.8 10*9/L Final   ??? Absolute Eosinophils 05/02/2017 0.1  0.0 - 0.4 10*9/L Final   ??? Absolute Basophils 05/02/2017 0.0  0.0 - 0.1 10*9/L Final   ??? Large Unstained Cells 05/02/2017 1  0 - 4 % Final   ??? Macrocytosis 05/02/2017 Slight* Not Present Final   ??? Anisocytosis 05/02/2017 Slight* Not Present Final   ??? Hypochromasia 05/02/2017 Slight* Not Present Final     Please call us if you experience:   1. Nausea or vomiting not controlled by nausea medicines  2. Fever of 100.4 F or higher   3. Uncontrolled pain  4. Any other concerning symptom     For any cancer-related concerns, including:   Appointment information   Questions regarding your cancer diagnosis or treatment   Any new symptoms.     Please call [phone].    On Nights, Weekends and Holidays please and ask for the oncologist on call.    [facility]  [address]  [place], [place] [address]  [email or web address]

## 2017-05-07 NOTE — Unmapped (Signed)
IDENTIFICATION: This is a 77 y.o. 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 She is s/p WBRT and had a PR on MRI initially. Thereafter, her MRI showed further improvement and she has remained in a CR. She had been slowly improving her functional capacity, but at her 08/09/16 visit, she was more weak and slurring her speech. 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 No chemotherapy 2/2 her baseline PS. Non-chemotherapy options for recurrent PCNSL include: ibrutinib CR: 42% (Choquet, ASH, 2016; Grommes, ASCO, 2017), revlimid CR: 30% (Ghesquieres ASH 2016), or nivolumab ORR: 100% (Nayak, Blood, 2017). Ibrutinib has been the most thoroughly studied with one study evaluating 52 patients. Nivolumab has excellent responses, but the study only evaluated 5 patients.     Decided to treat with ibrutinib and she has had improved/stable disease since this was started. MRI 09/15/16 showed stable disease, and MRI on 11/15/16 also showed stable disease. Her CBC is stable and she has had no bleeding events. Of note, patient is on a baby ASA, which we will continue in setting of PAD - this is not a contraindication for ibrutinib. Patient is overall stable today. Will c/w ibrutinib 560mg  daily. Will plan for MRI every 2-3 months. MRI 03/2017 shows stable disease. Will have patient RTC 3/27 with an MRI brain. Of note, the patient wishes to be DNR/DNI - this was documented in the chart.    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     SUMMARY:  1. Pt clinically stable today  2. Has fluctuating cognitive issues likely 2/2 radiation  3. C/w Ibrutinib 560mg  daily  4. RTC 3/27 w/ MRI brain  5. Palliative care seeing patient locally.    A total of 25 minutes were spent face-to-face with the patient during this encounter and over half of that time was spent on counseling and coordination of care.    Winona Legato, DO, MPH  Assistant Professor of Medicine  Division of Hematology and Oncology    ------------------------------------------------------    INTERVAL HISTORY:  Patient has stable neurologic and cognitive function from last month. She denies CP, SOB, NVD, B-symptoms.     Oncology History    -- 11/15/14: presented with weakness, aphasia, blurry vision.  MRI showed a left temporoparietal mass that crossed the spine of the corpus callosum. This was suspicious for lymphoma. 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 PET/CT without any evidence of systemic lymphoma   -- 11/20/14: started WBRT.  Chemotherapy was deferred due to underlying MDS and age.  The total radiation dose will be 5040 cGy.   -- 01/27/15: post WBRT MRI brain shows interval decrease in the size and enhancement of left posterior parietal periventricular lesion.  The lesion measures approximately 0.7 x 1.8 x 0.8 cm (previously 2.8 x 4.2 x 2.8 cm).         Primary CNS lymphoma (  CMS-HCC)    11/10/2014 Initial Diagnosis     Primary CNS lymphoma (RAF-HCC)          REVIEW OF SYSTEMS:  See HPI. A 10 system ROS is otherwise negative.     PHYSICAL EXAMINATION:  VITAL SIGNS:   Vitals:    05/02/17 0844   BP: 126/61   Pulse: 58   Temp: 36.8 ??C (98.2 ??F)   TempSrc: Oral   Weight: 97.8 kg (215 lb 8 oz)   Height: 162.6 cm (5' 4)     EXAM:  ECOG: 2  GENERAL: +slurred speech.  AAOx3  HEENT: Pupils equal, round, and reactive to light.   NECK: Supple, no lymphadenopathy, no thyromegaly.  LUNGS: Clear to auscultation bilaterally.  HEART: Regular rate and rhythm. No R/G/M  ABDOMEN: Soft, nontender, nondistended.    EXTREMITIES: 1+ edema bilaterally   NEURO EXAM: Motor/sensory intact BL UE and LE. Cranial nerves II-XII are intact.   LYMPH NODES: No LAD    LABORATORY DATA:  Lab on 05/02/2017   Component Date Value Ref Range Status   ??? Sodium 05/02/2017 142  135 - 145 mmol/L Final   ??? Potassium 05/02/2017 4.0  3.5 - 5.0 mmol/L Final   ??? Chloride 05/02/2017 105  98 - 107 mmol/L Final   ??? CO2 05/02/2017 27.0  22.0 - 30.0 mmol/L Final   ??? BUN 05/02/2017 16  7 - 21 mg/dL Final   ??? Creatinine 05/02/2017 0.75  0.60 - 1.00 mg/dL Final   ??? BUN/Creatinine Ratio 05/02/2017 21   Final   ??? EGFR MDRD Non Af Amer 05/02/2017 >=60  >=60 mL/min/1.48m2 Final   ??? EGFR MDRD Af Amer 05/02/2017 >=60  >=60 mL/min/1.48m2 Final   ??? Anion Gap 05/02/2017 10  9 - 15 mmol/L Final   ??? Glucose 05/02/2017 114  65 - 179 mg/dL Final   ??? Calcium 56/21/3086 9.1  8.5 - 10.2 mg/dL Final   ??? Albumin 57/84/6962 4.1  3.5 - 5.0 g/dL Final   ??? Total Protein 05/02/2017 6.1* 6.5 - 8.3 g/dL Final   ??? Total Bilirubin 05/02/2017 1.9* 0.0 - 1.2 mg/dL Final   ??? AST 95/28/4132 28  14 - 38 U/L Final   ??? ALT 05/02/2017 35  15 - 48 U/L Final   ??? Alkaline Phosphatase 05/02/2017 51  38 - 126 U/L Final   ??? LDH 05/02/2017 485  338 - 610 U/L Final   ??? WBC 05/02/2017 5.5  4.5 - 11.0 10*9/L Final   ??? RBC 05/02/2017 3.26* 4.00 - 5.20 10*12/L Final   ??? HGB 05/02/2017 10.0* 12.0 - 16.0 g/dL Final   ??? HCT 44/03/270 29.7* 36.0 - 46.0 % Final   ??? MCV 05/02/2017 90.9  80.0 - 100.0 fL Final   ??? MCH 05/02/2017 30.7  26.0 - 34.0 pg Final   ??? MCHC 05/02/2017 33.8  31.0 - 37.0 g/dL Final   ??? RDW 53/66/4403 17.0* 12.0 - 15.0 % Final   ??? MPV 05/02/2017 11.2* 7.0 - 10.0 fL Final   ??? Platelet 05/02/2017 136* 150 - 440 10*9/L Final   ??? Variable HGB Concentration 05/02/2017 Slight* Not Present Final   ??? Absolute Neutrophils 05/02/2017 4.3  2.0 - 7.5 10*9/L Final   ??? Absolute Lymphocytes 05/02/2017 0.6* 1.5 - 5.0 10*9/L Final   ??? Absolute Monocytes 05/02/2017 0.4  0.2 - 0.8 10*9/L Final   ??? Absolute Eosinophils 05/02/2017 0.1  0.0 - 0.4 10*9/L Final   ??? Absolute Basophils 05/02/2017 0.0  0.0 -  0.1 10*9/L Final   ??? Large Unstained Cells 05/02/2017 1  0 - 4 % Final   ??? Macrocytosis 05/02/2017 Slight* Not Present Final   ??? Anisocytosis 05/02/2017 Slight* Not Present Final   ??? Hypochromasia 05/02/2017 Slight* Not Present Final     IMAGING STUDIES:  MRI Brain (03/28/17):  Stable appearance of the brain. No significant change of the areas of enhancement along the superior aspect of the right lateral ventricle.    MRI Brain (01/08/17):  -Stable foci of enhancement in the splenium and along the superior aspect of the right lateral ventricle. No other sites of disease.  -Chronic severe mucosal thickening.    MRI Brain (11/15/16):  Unchanged linear enhancement in the region of the splenium of the corpus callosum. No new enhancing lesions.    MRI Brain (09/20/16):  Stable linear enhancement along the body and splenium of the corpus callosum, right greater than left.    MRI Brain (08/09/16):  Interval recurrence of linear enhancement along the body and splenium of the corpus callosum, right greater than left and suspicion of recurrence.    MRI Brain (02/04/16):  Stable exam without evidence of recurrent disease.    MRI Brain (11/03/15):  No evidence of residual enhancement in the splenium of the corpus callosum. No new disease.  Similar appearance of right maxillary sinusitis with fungal component or inspissated secretions.    MRI Brain (08/04/15):  Stable, minimal residual enhancement in the splenium of the corpus callosum. No new disease.    MRI Brain 01/27/15: Post Tx WBRT MRI.  - Interval decrease in the size and enhancement of left posterior parietal periventricular lesion involving splenium of the corpus callosum demostrating decreased enhancement and reduced perilesional vasogenic edema. parietal mass with corpus callosal involvement, consistent with response to whole brain radiation therapy.    PATHOLOGY REPORTS:  11/13/14: Cerebrospinal fluid:  - Involved by monoclonal B-cell population (11% of lymphocytes)  - Review of the cytospin reveals a few large abnormal appearing mononuclear cells and a limited flow cytometry panel is positive for a monoclonal B-cell population. 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

## 2017-05-18 MED ORDER — IBRUTINIB 560 MG TABLET: 560 mg | tablet | Freq: Every day | 5 refills | 0 days | Status: AC

## 2017-05-18 MED ORDER — IBRUTINIB 560 MG TABLET
Freq: Every day | ORAL | 5 refills | 0.00000 days | Status: CP
Start: 2017-05-18 — End: 2018-05-18

## 2017-05-18 NOTE — Unmapped (Signed)
[facility] Refill Coordination Note  Specialty Medication(s): IMBRUVICA 560MG   Additional Medications shipped: N/A    ZAREAH HUNZEKER, DOB: 10-Sep-1940  Phone: [phone] (home) , Alternate phone contact: N/A  Phone or address changes today?: No  All above HIPAA information was verified with patient's family member. Spoke with son who said he was fine with [place] sending this to her next Tuesday. Says she takes it every day.  Shipping Address: [address]  [place] [address]   Insurance changes? No    Completed refill call assessment today to schedule patient's medication shipment from the [facility] [phone]).      Confirmed the medication and dosage are correct and have not changed: Yes, regimen is correct and unchanged.    Confirmed patient started or stopped the following medications in the past month:  No, there are no changes reported at this time.    Are you tolerating your medication?:  Shilo reports tolerating the medication.    ADHERENCE      Did you miss any doses in the past 4 weeks? No missed doses reported.    FINANCIAL/SHIPPING    Delivery Scheduled: Yes, Expected medication delivery date: 05/22/17     The patient will receive an FSI print out for each medication shipped and additional FDA Medication Guides as required.  Patient education from [place] or Robet Leu may also be included in the shipment    Asyria did not have any additional questions at this time.    Delivery address validated in FSI scheduling system: Yes, address listed in FSI is correct.    We will follow up with patient monthly for standard refill processing and delivery.      Thank you,  Renette Butters   [facility] Specialty Technician

## 2017-05-18 NOTE — Unmapped (Signed)
Addended by: Sophronia Simas on: 05/18/2017 11:16 AM     Modules accepted: Orders

## 2017-05-20 MED FILL — IMBRUVICA/560MG/TAB: IMBRUVICA/560MG/TAB | 28 days supply | Qty: 28 | Fill #0

## 2017-06-15 NOTE — Unmapped (Signed)
[facility] Refill Coordination Note  Specialty Medication(s): Imbruvica  Additional Medications shipped: n/a    Carolyn Andersen, DOB: 02/19/1941  Phone: [phone] (home) , Alternate phone contact: N/A  Phone or address changes today?: No  All above HIPAA information was verified with patient.  Shipping Address: [address]  [place] [address]   Insurance changes? No    Completed refill call assessment today to schedule patient's medication shipment from the [facility] [phone]).      Confirmed the medication and dosage are correct and have not changed: Yes, regimen is correct and unchanged.    Confirmed patient started or stopped the following medications in the past month:  No, there are no changes reported at this time.    Are you tolerating your medication?:  Carolyn Andersen reports tolerating the medication.    ADHERENCE    Is this medicine transplant or covered by Medicare Part B? No.        Did you miss any doses in the past 4 weeks? No missed doses reported.    FINANCIAL/SHIPPING    Delivery Scheduled: Yes, Expected medication delivery date: 06/21/17     The patient will receive an FSI print out for each medication shipped and additional FDA Medication Guides as required.  Patient education from [place] or Robet Leu may also be included in the shipment.    Carolyn Andersen did not have any additional questions at this time.    Delivery address validated in FSI scheduling system: Yes, address listed in FSI is correct.    We will follow up with patient monthly for standard refill processing and delivery.      Thank you,  Gerrie Castiglia Vangie Bicker   [facility] Specialty Pharmacist

## 2017-06-20 MED FILL — IMBRUVICA/560MG/TAB: IMBRUVICA/560MG/TAB | 28 days supply | Qty: 28 | Fill #1

## 2017-06-29 ENCOUNTER — Ambulatory Visit: Admit: 2017-06-29 | Discharge: 2017-06-29 | Payer: MEDICARE

## 2017-06-29 ENCOUNTER — Other Ambulatory Visit: Admit: 2017-06-29 | Discharge: 2017-06-29 | Payer: MEDICARE

## 2017-06-29 DIAGNOSIS — C8589 Other specified types of non-Hodgkin lymphoma, extranodal and solid organ sites: Principal | ICD-10-CM

## 2017-06-29 DIAGNOSIS — I10 Essential (primary) hypertension: Secondary | ICD-10-CM

## 2017-06-29 DIAGNOSIS — C946 Myelodysplastic disease, not classified: Secondary | ICD-10-CM

## 2017-06-29 DIAGNOSIS — Z794 Long term (current) use of insulin: Secondary | ICD-10-CM

## 2017-06-29 DIAGNOSIS — E118 Type 2 diabetes mellitus with unspecified complications: Secondary | ICD-10-CM

## 2017-06-29 LAB — CBC W/ AUTO DIFF
BASOPHILS ABSOLUTE COUNT: 0 10*9/L (ref 0.0–0.1)
BASOPHILS RELATIVE PERCENT: 0.6 %
EOSINOPHILS ABSOLUTE COUNT: 0.1 10*9/L (ref 0.0–0.4)
EOSINOPHILS RELATIVE PERCENT: 2.4 %
HEMOGLOBIN: 10.2 g/dL — ABNORMAL LOW (ref 12.0–16.0)
LARGE UNSTAINED CELLS: 1 % (ref 0–4)
LYMPHOCYTES ABSOLUTE COUNT: 0.7 10*9/L — ABNORMAL LOW (ref 1.5–5.0)
LYMPHOCYTES RELATIVE PERCENT: 13.3 %
MEAN CORPUSCULAR HEMOGLOBIN CONC: 33.9 g/dL (ref 31.0–37.0)
MEAN CORPUSCULAR HEMOGLOBIN: 30.9 pg (ref 26.0–34.0)
MEAN PLATELET VOLUME: 10.8 fL — ABNORMAL HIGH (ref 7.0–10.0)
MONOCYTES ABSOLUTE COUNT: 0.3 10*9/L (ref 0.2–0.8)
NEUTROPHILS ABSOLUTE COUNT: 4.1 10*9/L (ref 2.0–7.5)
NEUTROPHILS RELATIVE PERCENT: 77.4 %
PLATELET COUNT: 138 10*9/L — ABNORMAL LOW (ref 150–440)
RED BLOOD CELL COUNT: 3.3 10*12/L — ABNORMAL LOW (ref 4.00–5.20)
RED CELL DISTRIBUTION WIDTH: 17.3 % — ABNORMAL HIGH (ref 12.0–15.0)
WBC ADJUSTED: 5.4 10*9/L (ref 4.5–11.0)

## 2017-06-29 LAB — COMPREHENSIVE METABOLIC PANEL
ALBUMIN: 4.2 g/dL (ref 3.5–5.0)
ALKALINE PHOSPHATASE: 45 U/L (ref 38–126)
ALT (SGPT): 28 U/L (ref 15–48)
ANION GAP: 7 mmol/L — ABNORMAL LOW (ref 9–15)
BILIRUBIN TOTAL: 2.5 mg/dL — ABNORMAL HIGH (ref 0.0–1.2)
BLOOD UREA NITROGEN: 12 mg/dL (ref 7–21)
BUN / CREAT RATIO: 17
CALCIUM: 9.5 mg/dL (ref 8.5–10.2)
CHLORIDE: 107 mmol/L (ref 98–107)
CO2: 30 mmol/L (ref 22.0–30.0)
EGFR MDRD AF AMER: >=60 mL/min/{1.73_m2} (ref >=60)
EGFR MDRD NON AF AMER: >=60 mL/min/{1.73_m2} (ref >=60)
GLUCOSE RANDOM: 112 mg/dL (ref 65–179)
POTASSIUM: 4.3 mmol/L (ref 3.5–5.0)
PROTEIN TOTAL: 6.6 g/dL (ref 6.5–8.3)
SODIUM: 144 mmol/L (ref 135–145)

## 2017-06-29 LAB — AST (SGOT): Aspartate aminotransferase:CCnc:Pt:Ser/Plas:Qn:: 21

## 2017-06-29 LAB — EOSINOPHILS RELATIVE PERCENT: Lab: 2.4

## 2017-06-29 LAB — LACTATE DEHYDROGENASE: Lactate dehydrogenase:CCnc:Pt:Ser/Plas:Qn:: 517

## 2017-06-29 MED ORDER — ATORVASTATIN 40 MG TABLET
ORAL_TABLET | Freq: Every day | ORAL | 0 refills | 0.00000 days | Status: CP
Start: 2017-06-29 — End: 2017-06-29

## 2017-06-29 MED ORDER — ATORVASTATIN 40 MG TABLET: 40 mg | tablet | 0 refills | 0 days

## 2017-06-29 NOTE — Unmapped (Addendum)
IDENTIFICATION: This is a 77 y.o. 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 She is s/p WBRT and had a PR on MRI initially. Thereafter, her MRI showed further improvement, consider a CR. She had been slowly improving her functional capacity, but at her 08/09/16 visit, she was more weak and slurring her speech. 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 No chemotherapy 2/2 her baseline PS. Non-chemotherapy options for recurrent PCNSL include: ibrutinib CR: 42% (Choquet, ASH, 2016; Grommes, ASCO, 2017), revlimid CR: 30% (Ghesquieres ASH 2016), or nivolumab ORR: 100% (Nayak, Blood, 2017). Ibrutinib has been the most thoroughly studied with one study evaluating 52 patients. Nivolumab has excellent responses, but the study only evaluated 5 patients.     Decided to treat with ibrutinib and she has had improved/stable disease since this was started in May of 2018. Her CBC is stable and she has had no bleeding events. 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 Patient is overall stable today and MRI brain shows stable disease. Will c/w ibrutinib 560mg  daily. Will plan for MRI in 3 months.     Of note, the patient is DNR/DNI - this was documented in the chart. Palliative care seeing patient locally.    Other Issues:  Aspiration Risk: She needs the bed >30 degrees since she has a brain lymphoma, poor PS, and could aspirate.     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     A total of 25 minutes were spent face-to-face with the patient during this encounter and over half of that time was spent on counseling and coordination of care.     Winona Legato, DO, MPH  Assistant Professor of Medicine  Division of Hematology and Oncology    ------------------------------------------------------    INTERVAL HISTORY:  Patient has stable neurologic and cognitive function since last seen three months ago. She denies CP, SOB, NVD, B-symptoms.   She did have an episode of conjunctivitis since our last visit but this has resolved.  She denies palpitations, bleeding, melena, brbpr.      Oncology History    -- 11/15/14: presented with weakness, aphasia, blurry vision.  MRI showed a left temporoparietal mass that crossed the spine of the corpus callosum. This was suspicious for lymphoma. 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 PET/CT without any evidence of systemic lymphoma   -- 11/20/14: started WBRT.  Chemotherapy was deferred due to underlying MDS and age.  The total radiation dose will be 5040 cGy.   -- 01/27/15: post WBRT MRI  brain shows interval decrease in the size and enhancement of left posterior parietal periventricular lesion.  The lesion measures approximately 0.7 x 1.8 x 0.8 cm (previously 2.8 x 4.2 x 2.8 cm).         Primary CNS lymphoma (CMS-HCC)    11/10/2014 Initial Diagnosis     Primary CNS lymphoma (RAF-HCC)          REVIEW OF SYSTEMS:  See HPI. A 10 system ROS is otherwise negative.     PHYSICAL EXAMINATION:  VITAL SIGNS:   Vitals:    06/29/17 1411   BP: 136/60   Pulse: 66   Resp: 16   Temp: 36.4 ??C (97.5 ??F)   TempSrc: Oral   SpO2: 97%   Weight: 99.1 kg (218 lb 6.4 oz)   Height: 162.6 cm (5' 4.02)     EXAM:  ECOG: 2  GENERAL: +slurred speech.  AAOx3  HEENT: Pupils equal, round, and reactive to light. +palatal petechiae  NECK: Supple, no lymphadenopathy, no thyromegaly.  LUNGS: Clear to auscultation bilaterally.  HEART: Regular rate and rhythm. No R/G/M  ABDOMEN: Soft, nontender, nondistended.    EXTREMITIES: 1+ edema bilaterally   NEURO EXAM: Motor/sensory intact BL UE and LE. Cranial nerves II-XII are intact.   LYMPH NODES: No LAD    LABORATORY DATA:  Lab on 06/29/2017   Component Date Value Ref Range Status   ??? Sodium 06/29/2017 144  135 - 145 mmol/L Final   ??? Potassium 06/29/2017 4.3  3.5 - 5.0 mmol/L Final   ??? Chloride 06/29/2017 107  98 - 107 mmol/L Final   ??? CO2 06/29/2017 30.0  22.0 - 30.0 mmol/L Final   ??? BUN 06/29/2017 12  7 - 21 mg/dL Final   ??? Creatinine 06/29/2017 0.71  0.60 - 1.00 mg/dL Final   ??? BUN/Creatinine Ratio 06/29/2017 17   Final   ??? EGFR MDRD Non Af Amer 06/29/2017 >=60  >=60 mL/min/1.7m2 Final   ??? EGFR MDRD Af Amer 06/29/2017 >=60  >=60 mL/min/1.58m2 Final   ??? Anion Gap 06/29/2017 7* 9 - 15 mmol/L Final   ??? Glucose 06/29/2017 112  65 - 179 mg/dL Final   ??? Calcium 16/12/9602 9.5  8.5 - 10.2 mg/dL Final   ??? Albumin 54/11/8117 4.2  3.5 - 5.0 g/dL Final   ??? Total Protein 06/29/2017 6.6  6.5 - 8.3 g/dL Final   ??? Total Bilirubin 06/29/2017 2.5* 0.0 - 1.2 mg/dL Final   ??? AST 14/78/2956 21  14 - 38 U/L Final   ??? ALT 06/29/2017 28  15 - 48 U/L Final   ??? Alkaline Phosphatase 06/29/2017 45  38 - 126 U/L Final   ??? LDH 06/29/2017 517  338 - 610 U/L Final   ??? WBC 06/29/2017 5.4  4.5 - 11.0 10*9/L Final   ??? RBC 06/29/2017 3.30* 4.00 - 5.20 10*12/L Final   ??? HGB 06/29/2017 10.2* 12.0 - 16.0 g/dL Final   ??? HCT 21/30/8657 30.1* 36.0 - 46.0 % Final   ??? MCV 06/29/2017 91.2  80.0 - 100.0 fL Final   ??? MCH 06/29/2017 30.9  26.0 - 34.0 pg Final   ??? MCHC 06/29/2017 33.9  31.0 - 37.0 g/dL Final   ??? RDW 84/69/6295 17.3* 12.0 - 15.0 % Final   ??? MPV 06/29/2017 10.8* 7.0 - 10.0 fL Final   ??? Platelet 06/29/2017 138* 150 - 440 10*9/L Final   ??? Variable HGB Concentration 06/29/2017 Slight* Not Present Final   ??? Neutrophils % 06/29/2017 77.4  %  Final   ??? Lymphocytes % 06/29/2017 13.3  % Final   ??? Monocytes % 06/29/2017 5.6  % Final   ??? Eosinophils % 06/29/2017 2.4  % Final   ??? Basophils % 06/29/2017 0.6  % Final   ??? Absolute Neutrophils 06/29/2017 4.1  2.0 - 7.5 10*9/L Final   ??? Absolute Lymphocytes 06/29/2017 0.7* 1.5 - 5.0 10*9/L Final   ??? Absolute Monocytes 06/29/2017 0.3  0.2 - 0.8 10*9/L Final   ??? Absolute Eosinophils 06/29/2017 0.1  0.0 - 0.4 10*9/L Final   ??? Absolute Basophils 06/29/2017 0.0  0.0 - 0.1 10*9/L Final   ??? Large Unstained Cells 06/29/2017 1  0 - 4 % Final   ??? Macrocytosis 06/29/2017 Slight* Not Present Final   ??? Anisocytosis 06/29/2017 Slight* Not Present Final   Hospital Outpatient Visit on 06/29/2017   Component Date Value Ref Range Status   ??? Creatinine Whole Blood, POC 06/29/2017 0.8  0.7 - 1.1 mg/dL Final   ??? eGFR MDRD Af Amer 06/29/2017 >=60  >=60 mL/min/1.57m2 Final   ??? eGFR MDRD Non Af Amer 06/29/2017 >=60  >=60 mL/min/1.90m2 Final     IMAGING STUDIES:  MR Brain (06/29/17):     Impression     -Stable exam with unchanged foci of enhancement within the splenium of the corpus callosum.     MRI Brain (03/28/17):  Stable appearance of the brain. No significant change of the areas of enhancement along the superior aspect of the right lateral ventricle.    MRI Brain (01/08/17):  -Stable foci of enhancement in the splenium and along the superior aspect of the right lateral ventricle. No other sites of disease.  -Chronic severe mucosal thickening.    MRI Brain (11/15/16):  Unchanged linear enhancement in the region of the splenium of the corpus callosum. No new enhancing lesions.    MRI Brain (09/20/16):  Stable linear enhancement along the body and splenium of the corpus callosum, right greater than left. MRI Brain (08/09/16):  Interval recurrence of linear enhancement along the body and splenium of the corpus callosum, right greater than left and suspicion of recurrence.    MRI Brain (02/04/16):  Stable exam without evidence of recurrent disease.    MRI Brain (11/03/15):  No evidence of residual enhancement in the splenium of the corpus callosum. No new disease.  Similar appearance of right maxillary sinusitis with fungal component or inspissated secretions.    MRI Brain (08/04/15):  Stable, minimal residual enhancement in the splenium of the corpus callosum. No new disease.    MRI Brain 01/27/15: Post Tx WBRT MRI.  - Interval decrease in the size and enhancement of left posterior parietal periventricular lesion involving splenium of the corpus callosum demostrating decreased enhancement and reduced perilesional vasogenic edema. parietal mass with corpus callosal involvement, consistent with response to whole brain radiation therapy.    PATHOLOGY REPORTS:  11/13/14: Cerebrospinal fluid:  - Involved by monoclonal B-cell population (11% of lymphocytes)  - Review of the cytospin reveals a few large abnormal appearing mononuclear cells and a limited flow cytometry panel is positive for a monoclonal B-cell population. 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

## 2017-06-29 NOTE — Unmapped (Addendum)
Your MRI is stable without any worsening which is great news. We are going to continue the ibrutinib treatment.     We will plan to see you back in three months with another MRI.     We are going to investigate why you are on aspirin and pletal (cilostazol) because you should probably not be on those at the same time as ibrutinib unless absolutely necessary.      Labwork:  Lab on 06/29/2017   Component Date Value Ref Range Status   ??? Sodium 06/29/2017 144  135 - 145 mmol/L Final   ??? Potassium 06/29/2017 4.3  3.5 - 5.0 mmol/L Final   ??? Chloride 06/29/2017 107  98 - 107 mmol/L Final   ??? CO2 06/29/2017 30.0  22.0 - 30.0 mmol/L Final   ??? BUN 06/29/2017 12  7 - 21 mg/dL Final   ??? Creatinine 06/29/2017 0.71  0.60 - 1.00 mg/dL Final   ??? BUN/Creatinine Ratio 06/29/2017 17   Final   ??? EGFR MDRD Non Af Amer 06/29/2017 >=60  >=60 mL/min/1.47m2 Final   ??? EGFR MDRD Af Amer 06/29/2017 >=60  >=60 mL/min/1.73m2 Final   ??? Anion Gap 06/29/2017 7* 9 - 15 mmol/L Final   ??? Glucose 06/29/2017 112  65 - 179 mg/dL Final   ??? Calcium 16/12/9602 9.5  8.5 - 10.2 mg/dL Final   ??? Albumin 54/11/8117 4.2  3.5 - 5.0 g/dL Final   ??? Total Protein 06/29/2017 6.6  6.5 - 8.3 g/dL Final   ??? Total Bilirubin 06/29/2017 2.5* 0.0 - 1.2 mg/dL Final   ??? AST 14/78/2956 21  14 - 38 U/L Final   ??? ALT 06/29/2017 28  15 - 48 U/L Final   ??? Alkaline Phosphatase 06/29/2017 45  38 - 126 U/L Final   ??? LDH 06/29/2017 517  338 - 610 U/L Final   ??? WBC 06/29/2017 5.4  4.5 - 11.0 10*9/L Final   ??? RBC 06/29/2017 3.30* 4.00 - 5.20 10*12/L Final   ??? HGB 06/29/2017 10.2* 12.0 - 16.0 g/dL Final   ??? HCT 21/30/8657 30.1* 36.0 - 46.0 % Final   ??? MCV 06/29/2017 91.2  80.0 - 100.0 fL Final   ??? MCH 06/29/2017 30.9  26.0 - 34.0 pg Final   ??? MCHC 06/29/2017 33.9  31.0 - 37.0 g/dL Final   ??? RDW 84/69/6295 17.3* 12.0 - 15.0 % Final   ??? MPV 06/29/2017 10.8* 7.0 - 10.0 fL Final   ??? Platelet 06/29/2017 138* 150 - 440 10*9/L Final   ??? Variable HGB Concentration 06/29/2017 Slight* Not Present Final   ??? Neutrophils % 06/29/2017 77.4  % Final   ??? Lymphocytes % 06/29/2017 13.3  % Final   ??? Monocytes % 06/29/2017 5.6  % Final   ??? Eosinophils % 06/29/2017 2.4  % Final   ??? Basophils % 06/29/2017 0.6  % Final   ??? Absolute Neutrophils 06/29/2017 4.1  2.0 - 7.5 10*9/L Final   ??? Absolute Lymphocytes 06/29/2017 0.7* 1.5 - 5.0 10*9/L Final   ??? Absolute Monocytes 06/29/2017 0.3  0.2 - 0.8 10*9/L Final   ??? Absolute Eosinophils 06/29/2017 0.1  0.0 - 0.4 10*9/L Final   ??? Absolute Basophils 06/29/2017 0.0  0.0 - 0.1 10*9/L Final   ??? Large Unstained Cells 06/29/2017 1  0 - 4 % Final   ??? Macrocytosis 06/29/2017 Slight* Not Present Final   ??? Anisocytosis 06/29/2017 Slight* Not Present Final   Hospital Outpatient Visit on 06/29/2017   Component  Date Value Ref Range Status   ??? Creatinine Whole Blood, POC 06/29/2017 0.8  0.7 - 1.1 mg/dL Final   ??? eGFR MDRD Af Amer 06/29/2017 >=60  >=60 mL/min/1.39m2 Final   ??? eGFR MDRD Non Af Amer 06/29/2017 >=60  >=60 mL/min/1.92m2 Final       Please call us if you experience:   1. Nausea or vomiting not controlled by nausea medicines  2. Fever of 100.4 F or higher   3. Uncontrolled pain  4. Any other concerning symptom     For any cancer-related concerns, including:   Appointment information   Questions regarding your cancer diagnosis or treatment   Any new symptoms.     Please call [phone].    On Nights, Weekends and Holidays please and ask for the oncologist on call.    [place] [facility]  [address]  [place], [place] [address]  [email or web address]

## 2017-06-29 NOTE — Unmapped (Signed)
Labs drawn and sent for analysis.  Care provided by  Earley Favor, RN

## 2017-07-11 NOTE — Unmapped (Signed)
[facility] Refill Coordination Note    Medication(s) to be Shipped:   IMBRUVICA 560MG   Other medications to be shipped:       Carolyn Andersen, DOB: 06-Aug-1940  Phone: [phone] (home)   Shipping Address: [address]  [place] [address]    All above HIPAA information was verified with patient.     Completed refill call assessment today to schedule patient's medication shipment from the [facility] [phone]).       Specialty medication(s) and dose(s) confirmed: Regimen is correct and unchanged.   Changes to medications: [name] reports no changes reported at this time.  Changes to insurance: No  Questions for the pharmacist: No    The patient will receive an FSI print out for each medication shipped and additional FDA Medication Guides as required.  Patient education from [place] or Robet Leu may also be included in the shipment.    DISEASE-SPECIFIC INFORMATION        N/A    ADHERENCE     Medication Adherence    Support network for adherence:  family member              Is this medicine covered by Medicare Part B? No.         SHIPPING     Shipping address confirmed in FSI.     Delivery Scheduled: Yes, Expected medication delivery date: [phone] via UPS or courier.     Carolyn Andersen   [facility] Specialty Technician

## 2017-07-12 NOTE — Unmapped (Signed)
Patient was calling about rash/itcing on legs and wants to know if he can take Benadryl.    Thanks in advance,  Yolanda Manges.  [facility] Cancer Communication Center  [phone]

## 2017-07-12 NOTE — Unmapped (Signed)
Pt's son was calling to see if a prescription for a hospital bed has been written. He can be reached at [phone].  Thanks in advance,  Deatra Ina  [facility] Cancer Communication Center  [phone]

## 2017-07-12 NOTE — Unmapped (Signed)
[facility] Triage Note     Patient: Carolyn Andersen     Reason for call:  pt with rash to legs    Time call returned: [phone]     Phone Assessment: returned call to pt. Spoke with both pt and son. Pt reports rash for a couple of weeks. Located between ankle and lower knee.Getting gradually worse.  Wakes up in the middle of night due to  itching. Swelling present to both legs. HH RN saw today and felt that rash was related to increased swelling. Notes that rash is red and raised in areas. Pt reports that some lesions have drainage.     HH RN encouraged pt to elevate legs and use compression stocking.     Pt wants to know if she can take benadryl     Triage Recommendations: Encouraged pt to only use mild soaps and detergents. Reviewed signs and symptoms of infection and encouraged her to notify team or PCP with any s/s of infection.    Recommended benadryl po to ease itching. Encouraged her to go to urgent care or pcp if rash worsens or does not improve. Told her I would notify team and call if back with any other suggestions/recommendations     Patient Response: verbalized understanding and agreement with plan     Outstanding tasks: follow up with pt as needed.    Patient Pharmacy has been verified and primary pharmacy has been marked as preferred

## 2017-07-17 MED FILL — IMBRUVICA/560MG/TAB: IMBRUVICA/560MG/TAB | 28 days supply | Qty: 28 | Fill #2

## 2017-07-17 NOTE — Unmapped (Signed)
Returned call to pt to follow up regarding 4/18 call. No answer. LVM with call back number Wanted to notify pt that RX for bed was sent to [facility] and follow up on pt's lower extremity rash.

## 2017-07-17 NOTE — Unmapped (Signed)
Pt as returning your call. She can be reached at [phone].  Thanks in advance,  Deatra Ina  [facility] Cancer Communication Center  [phone]

## 2017-07-19 NOTE — Unmapped (Signed)
[facility] Triage Note     Patient: Carolyn Andersen     Reason for call:  returning pt call    Time call returned: [phone]     Phone Assessment: returned to call to pt with no answer. LVM with call back number     Triage Recommendations: NA     Patient Response: NA       Outstanding tasks: awaiting call back   Patient Pharmacy has been verified and primary pharmacy has been marked as preferred

## 2017-07-27 DIAGNOSIS — R400 Somnolence: Principal | ICD-10-CM

## 2017-07-27 LAB — CBC W/ AUTO DIFF
BASOPHILS ABSOLUTE COUNT: 0.1 10*9/L (ref 0.0–0.1)
BASOPHILS RELATIVE PERCENT: 0.3 %
EOSINOPHILS ABSOLUTE COUNT: 0.4 10*9/L (ref 0.0–0.4)
HEMATOCRIT: 33.4 % — ABNORMAL LOW (ref 36.0–46.0)
HEMOGLOBIN: 10.6 g/dL — ABNORMAL LOW (ref 12.0–16.0)
LARGE UNSTAINED CELLS: 0 % (ref 0–4)
LYMPHOCYTES ABSOLUTE COUNT: 0.5 10*9/L — ABNORMAL LOW (ref 1.5–5.0)
LYMPHOCYTES RELATIVE PERCENT: 3.1 %
MEAN CORPUSCULAR HEMOGLOBIN CONC: 31.8 g/dL (ref 31.0–37.0)
MEAN CORPUSCULAR VOLUME: 94.6 fL (ref 80.0–100.0)
MEAN PLATELET VOLUME: 10.7 fL — ABNORMAL HIGH (ref 7.0–10.0)
MONOCYTES ABSOLUTE COUNT: 0.7 10*9/L (ref 0.2–0.8)
MONOCYTES RELATIVE PERCENT: 4.7 %
NEUTROPHILS ABSOLUTE COUNT: 13.3 10*9/L — ABNORMAL HIGH (ref 2.0–7.5)
NEUTROPHILS RELATIVE PERCENT: 88.6 %
PLATELET COUNT: 134 10*9/L — ABNORMAL LOW (ref 150–440)
RED BLOOD CELL COUNT: 3.53 10*12/L — ABNORMAL LOW (ref 4.00–5.20)
RED CELL DISTRIBUTION WIDTH: 16.8 % — ABNORMAL HIGH (ref 12.0–15.0)
WBC ADJUSTED: 15 10*9/L — ABNORMAL HIGH (ref 4.5–11.0)

## 2017-07-27 LAB — COMPREHENSIVE METABOLIC PANEL
ALBUMIN: 4.8 g/dL (ref 3.5–5.0)
ALKALINE PHOSPHATASE: 53 U/L (ref 38–126)
ALT (SGPT): 27 U/L (ref 15–48)
ANION GAP: 15 mmol/L (ref 9–15)
AST (SGOT): 31 U/L (ref 14–38)
BILIRUBIN TOTAL: 2.4 mg/dL — ABNORMAL HIGH (ref 0.0–1.2)
BLOOD UREA NITROGEN: 18 mg/dL (ref 7–21)
BUN / CREAT RATIO: 26
CALCIUM: 9.6 mg/dL (ref 8.5–10.2)
CHLORIDE: 103 mmol/L (ref 98–107)
CO2: 23 mmol/L (ref 22.0–30.0)
EGFR MDRD AF AMER: >=60 mL/min/{1.73_m2} (ref >=60)
EGFR MDRD NON AF AMER: >=60 mL/min/{1.73_m2} (ref >=60)
GLUCOSE RANDOM: 129 mg/dL (ref 65–179)
POTASSIUM: 4.4 mmol/L (ref 3.5–5.0)
PROTEIN TOTAL: 7.2 g/dL (ref 6.5–8.3)
SODIUM: 141 mmol/L (ref 135–145)

## 2017-07-27 LAB — WBC UA: Lab: 122 — ABNORMAL HIGH

## 2017-07-27 LAB — ETHANOL: Ethanol:MCnc:Pt:Ser/Plas:Qn:GC: <10

## 2017-07-27 LAB — TOXICOLOGY SCREEN, URINE
BARBITURATE SCREEN URINE: <200
BENZODIAZEPINE SCREEN, URINE: <200
CANNABINOID SCREEN URINE: <20
COCAINE(METAB.)SCREEN, URINE: <150
METHADONE SCREEN, URINE: <300
OPIATE SCREEN URINE: <300

## 2017-07-27 LAB — BLOOD GAS CRITICAL CARE PANEL, VENOUS
BASE EXCESS VENOUS: 0.4 (ref -2.0–2.0)
GLUCOSE WHOLE BLOOD: 139 mg/dL
HCO3 VENOUS: 26 mmol/L (ref 22–27)
HEMOGLOBIN BLOOD GAS: 10.4 g/dL — ABNORMAL LOW (ref 12.00–16.00)
LACTATE BLOOD VENOUS: 1.8 mmol/L (ref 0.5–1.8)
O2 SATURATION VENOUS: 23.9 % — ABNORMAL LOW (ref 40.0–85.0)
PCO2 VENOUS: 52 mmHg (ref 40–60)
PO2 VENOUS: <20 mmHg — ABNORMAL LOW (ref 30–55)
POTASSIUM WHOLE BLOOD: 4.2 mmol/L (ref 3.4–4.6)
SODIUM WHOLE BLOOD: 141 mmol/L (ref 135–145)

## 2017-07-27 LAB — BASE EXCESS VENOUS: Base excess:SCnc:Pt:BldV:Qn:Calculated: 0.4

## 2017-07-27 LAB — URINALYSIS WITH CULTURE REFLEX
BILIRUBIN UA: NEGATIVE
GLUCOSE UA: NEGATIVE
NITRITE UA: NEGATIVE
RBC UA: 10 /HPF — ABNORMAL HIGH (ref <=4)
SPECIFIC GRAVITY UA: 1.014 (ref 1.003–1.030)
SQUAMOUS EPITHELIAL: <1 /HPF (ref 0–5)
UROBILINOGEN UA: 0.2
WBC UA: 122 /HPF — ABNORMAL HIGH (ref 0–5)

## 2017-07-27 LAB — SMEAR REVIEW

## 2017-07-27 LAB — HEMATOCRIT: Lab: 33.4 — ABNORMAL LOW

## 2017-07-27 LAB — PROTEIN TOTAL: Protein:MCnc:Pt:Ser/Plas:Qn:: 7.2

## 2017-07-27 LAB — THYROID STIMULATING HORMONE: Thyrotropin:ACnc:Pt:Ser/Plas:Qn:: 1.384

## 2017-07-27 LAB — AMPHETAMINE SCREEN URINE: Lab: <500

## 2017-07-27 LAB — MAGNESIUM: Magnesium:MCnc:Pt:Ser/Plas:Qn:: 1.9

## 2017-07-27 LAB — LIPASE: Triacylglycerol lipase:CCnc:Pt:Ser/Plas:Qn:: 198

## 2017-07-27 LAB — TROPONIN I: Troponin I.cardiac:MCnc:Pt:Ser/Plas:Qn:: <0.034

## 2017-07-28 ENCOUNTER — Ambulatory Visit
Admit: 2017-07-28 | Discharge: 2017-08-03 | Disposition: A | Discharge status: Skilled Nursing Facility | Payer: MEDICARE

## 2017-07-28 NOTE — Unmapped (Signed)
Bed: 17-B  Expected date: 07/27/17  Expected time: 8:09 PM  Means of arrival:   Comments:  [facility] with pt who sts change in mobility and mental status per family (pt with HX of Brain Ca/Tumor)  R shoulder pain  EMS sts also change in speech  LLE edema and weeping

## 2017-07-28 NOTE — Unmapped (Addendum)
Carolyn Andersen is a 77 y.o. 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    Streptococcus dysgalactiae bacteremia: Patient initially presented with somnolence and AMS, which improved to cognitive baseline by time of discharge with antibiotic treatment. Blood cultures from 5/3 grew 2/2 Streptococcus dysgalactiae ss equisimilis (grp c/g).  Hemodynamically stable.  -- Ceftriaxone IV 2g q24h at 234mL/hr, end date 5/20 for 14-day course  -- Check CBC with platelets and differential, BUN, SCr on 5/16    Aerococcus urinae UTI: Treatment as above.    AMS - resolved: Due to above-listed infections. Treatment as above.  ??  PCNSL:??S/p WBRT on 12/2014. ??Incurable, relapsed now on??Ibrutinib since 08/19/16 with relatively stable disease since initiation. ??Last MRI April 2019 with stable disease with plan to continue ibrutinib 560 mg daily and repeat MRI in 3 months. Follows with Dr. Aviva Kluver at [facility].  -- Continue home ibrutinib on discharge, patient has home supply she will bring with her to the SNF she is discharging to  -- HCPOA is Zelina Jimerson, patient's son. Cell: [phone]    Anemia: Patient has a history of fluctuating Hgb ~8-10 most likely 2/2 ACD. Without s/s of bleeding, benign abdomen on physical exam. HDS. Hemoglobin down to 7.2 s/p PICC line insertion on 5/8, stable since.  ??  HTN: Continue home regimen unchanged.

## 2017-07-28 NOTE — Unmapped (Signed)
Patient resting in stretcher in NAD with respirations even and unlabored. All needs and concerns managed. Patient rounding complete, call bell in reach, bed locked and in lowest position, and patient belongings at bedside within reach of patient.  Patient updated on plan of care. Will continue to monitor.

## 2017-07-28 NOTE — Unmapped (Signed)
E1 History and Physical    Assessment/Plan:    Principal Problem:    Somnolence  Active Problems:    Primary CNS lymphoma (CMS-HCC)    Myelodysplastic disease (CMS-HCC)    Hypertension    Chronic left shoulder pain    Glaucoma (increased eye pressure)    Acute cystitis without hematuria      Carolyn Andersen is a 77 y.o. female with PMHx hypertension, peripheral arterial disease, low risk MDS, cognitive impairment, primary CNS lymphoma on ibrutinib who presents to [facility] with altered mental status and somnolence likely secondary to infection given urinalysis, leukocytosis, tachycardia, now with evolving goals of care with likely transition to hospice.    Altered mental status w/ somnolence: Acute change in mental status on morning of admission noted by son with whom she lives.  Observed patient with chills and episode of dry heaves without vomiting.  No known cough, no measured fever patient persistently tachycardic with new oxygen requirement in the emergency department.  Imaging significant for chest x-ray with nodular opacity in left lung base new from prior possible mass or new infection.  Small left pleural effusion.  CT head without acute intracranial process areas of hypoattenuation in the periventricular and subcortical white matter thought to correspond to previously identified T2/flair signal overall unchanged consider posttreatment changes.  Leukocytosis to 15 from prior normal, urinalysis consistent with infection with large leuk esterase, 122 white blood cells and moderate bacteria.  Patient has been treated for UTIs with her general practitioner at [facility] in November (secondary to Proteus, treated with ciprofloxacin) and December 2018 also treated with ciprofloxacin.  Anticipate infection is likely etiology for altered mental status, urine versus lung (new oxygen requirement) versus  skin source with weeping, erythematous legs in setting of known venous insufficiency, question cellulitis vs stasis dermatitis.  -Status post vancomycin, cefepime, clindamycin in the emergency department.  Will hold on further antibiotics based on evolving goals of care as discussed below.  -Received 2 L normal saline in emergency department, blood pressure stable, tachycardia improving  -Follow-up blood and urine cultures  -Albuterol nebulizer for wheezing, increased work of breathing    GOC: Extensive conversation with patient's son at bedside.  Patient's son is healthcare power of attorney.  Endorses that prior to deterioration, he and his mother had discussed CODE STATUS and that she expressed desire to be DNR/DNI and would want to focus on quality of life over quantity.  He is concerned that patient already has very minimal quality of life with decreased mobility, persistent cognitive impairment with recent decline, and resistance to interventions such as use of hospital bed or wheelchair.  He is concerned that attempts to treat infection may prolong life but would not improve quality of life and, may cause further deconditioning that would reduce her already, in his words, poor quality of life.  Based on conversations he has had with patient previously, would want her to die naturally.  He denies any ongoing engagement with palliative care as an outpatient says that they have a home care assistant who comes during weekdays to help with dressing changes.    -Patient appears comfortable at time of examination, plan for admission to discuss next steps including referral for home hospice per patient's son request  -Patient is received antibiotics in the emergency department, will hold on further administration at this time.  Will continue vital sign check to evaluate for any unanticipated decompensation overnight with anticipated transition to full comfort in the morning.  Holding all  nonessential medications at this time including ibrutinib based on my conversation with the son at time of admission.    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  Incurable, relapsed now on Ibrutinib since 08/19/16 with relatively stable disease since initiation.  Last MRI April 2019 with stable disease with plan to continue ibrutinib 560 mg daily and repeat MRI in 3 months.   -Holding ibrutinib given shifting goals of care per patient's healthcare power of attorney    Patient has history of peripheral arterial disease as well as hypertension.  Holding medications for these at this time.    FEN: Hold on further fluids given pleural effusion and desire to avoid volume overload  GI: Not indicated  DVT ppx: Not consistent with goals of care and comfort measures  Code: DNR/DNI confirmed with patient's healthcare power of attorney, Alex  Dispo: E1, floor status.  Anticipate transition to home hospice  ___________________________________________________________________    Chief Complaint  Chief Complaint   Patient presents with   ??? Altered Mental Status       HPI:  Carolyn Andersen is a 77 y.o. 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    History is obtained from patient's son.    Son reports that patient was found in the bathroom this morning with difficulty getting out of the bathroom they brought a chair over to the patient which he promptly sat down on and then he describes her crumpling into the chair with episode of shivering.  Also notes dry heaves today.  Reports that patient does have a history of cognitive impairment with steady decline over the recent months but that she typically will be able to identify patient and is more communicative.  He says that today he found her to be much more sleepy, difficult to arouse, and severely altered to the point that he called EMS for transport to the emergency department.    Patient has limited functional status at baseline, does feed herself but requires assistance with other activities of daily living.  Is able to ambulate but slowly and often requires assistance.  Her only recent complaints have been leg pain in the setting of increasing leg swelling recently.  Son describes her is very resistant to interventions and that she has hot getting a hospital bed for the last few months although they were able to convince her to get one which he had hoped would help relieve her offload some of the pain and swelling in her legs.  At her baseline mental status patient is able to identify her son and other loved ones.  He says that her memory has been getting worse since the whole brain radiation in 2016.    Patient does not endorse nausea but there was evidence of dry heaves today.  No vomiting.  Patient with a history of constipation for which she takes stool softeners but son is unaware of any diarrhea.  No measured fever.  Shivering perhaps indicative of chills on day of admission.  No sick contacts, no cough.      Oncology History    -- 11/15/14: presented with weakness, aphasia, blurry vision.  MRI showed a left temporoparietal mass that crossed the spine of the corpus callosum. This was suspicious for lymphoma. 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 PET/CT without any evidence of systemic lymphoma   -- 11/20/14: started WBRT.  Chemotherapy was deferred due to underlying MDS and age.  The total radiation dose will be 5040 cGy.   -- 01/27/15: post WBRT MRI brain shows interval decrease in the size and enhancement of left posterior parietal periventricular lesion.  The lesion measures approximately 0.7 x 1.8 x 0.8 cm (previously 2.8 x 4.2 x 2.8 cm).         Primary CNS lymphoma (CMS-HCC)    11/10/2014 Initial Diagnosis     Primary CNS lymphoma (RAF-HCC)                Allergies:  Other; Sulfasalazine; Decongestant (brompheniramine); Estrogens; Iodine and iodide containing products; Metoprolol; Pseudoephedrine hcl; Statins-hmg-coa reductase inhibitors; Sulfa (sulfonamide antibiotics); Crab; and Shellfish containing products    Medications:   Prior to Admission medications    Medication Dose, Route, Frequency   aspirin (ECOTRIN) 81 MG tablet 81 mg, Oral, Daily (standard)   atenolol (TENORMIN) 25 MG tablet 25 mg, Oral, Daily (standard)   atorvastatin (LIPITOR) 40 MG tablet 40 mg, Oral, Daily (standard)   brimonidine (ALPHAGAN) 0.2 % ophthalmic solution Ophthalmic   cetirizine (ZYRTEC) 10 MG tablet 10 mg, Oral, Daily (standard)   cilostazol (PLETAL) 100 MG tablet 100 mg, Oral, 2 times a day (standard)   dexamethasone (DECADRON) 4 MG tablet 8 mg, Oral   docusate sodium (COLACE) 100 MG capsule 100 mg, Oral, Nightly PRN   fluticasone (FLOVENT HFA) 220 mcg/actuation inhaler 2 puffs, Inhalation, Daily (standard)   furosemide (LASIX) 20 MG tablet Oral   glucosamine-chondroit-vit C-Mn (GLUCOSAMINE CHONDROITIN MAXSTR) 500-400 mg cap 1 capsule, Oral, daily   ibrutinib (IMBRUVICA) 560 mg tablet 560 mg, Oral, Daily (standard)   latanoprost (XALATAN) 0.005 % ophthalmic solution 1 drop, Both Eyes, Nightly   losartan (COZAAR) 100 MG tablet 100 mg, Oral   mometasone (ASMANEX TWISTHALER) 220 mcg (30 doses) AePB 1 puff, Inhalation   montelukast (SINGULAIR) 10 mg tablet 10 mg, Oral, Nightly   MULTIVIT-MIN/FA/LUTEIN/ZEAXANT (ICAPS MV ORAL) 1 capsule, Oral, Every 24 hours   senna (SENOKOT) 8.6 mg tablet 2 tablets, Oral   timolol (BETIMOL) 0.5 % ophthalmic solution 1 drop, Both Eyes, 2 times a day (standard)       Medical History:  Past Medical History:   Diagnosis Date   ??? Asthma    ??? Diabetes mellitus (CMS-HCC)    ??? Granuloma annulare    ??? High cholesterol    ??? Hypertension    ??? Myelodysplastic disease (CMS-HCC)    ??? PVD (peripheral vascular disease) (CMS-HCC)    ??? Seasonal allergies        Surgical History:  Past Surgical History:   Procedure Laterality Date   ??? DILATION AND CURETTAGE OF UTERUS     ??? EYE SURGERY     ??? SHOULDER ARTHROSCOPY     ??? TUBAL LIGATION         Social History: Tobacco use:   reports that she quit smoking about 2 years ago. Her smoking use included cigarettes. She has a 90.00 pack-year smoking history. She has never used smokeless tobacco.  Alcohol use:   reports that she does not drink alcohol.  Drug use:  reports that she does not use drugs.  Living situation: the patient lives with their son.    Family History:  Family History   Problem Relation Age of Onset   ??? Coronary artery disease Father    ??? Cancer Mother        Review of Systems:  10 systems reviewed and are  negative unless otherwise mentioned in HPI      Physical Exam:  Temp:  [37.2 ??C-37.4 ??C] 37.2 ??C  Heart Rate:  [72-116] 72  SpO2 Pulse:  [106-113] 113  Resp:  [17-26] 17  BP: (137-188)/(38-107) 137/49  SpO2:  [92 %-96 %] 96 %  There is no height or weight on file to calculate BMI.    GEN: Elderly woman, eyes closed, audible wheezing,   ENT: Dry mucous membrane  CV: Tachycardic, no murmurs appreciated  PULM: Expiratory wheezing anteriorly, crackles at bases  ABD: soft, NT mildly distended, decreased BS  EXT: Bilateral lower extremity edema with erythema and warmth from ankle to knee, patient does not respond to palpation.  No purulent drainage, some serous fluid dried in setting of weeping.  NEURO: Patient not cooperative with examination, opens eyes to voice, does not follow commands.  Painful stimulation not elicited    Test Results:  Data Review:  I have reviewed the labs and studies from the last 24 hours.    Imaging: Radiology studies were personally reviewed    EKG: sinus tachycardia.

## 2017-07-28 NOTE — Unmapped (Addendum)
Patients son expressed interest in having a conversation about patients code status and interventions necessary for treatment. MD notified and MDE paged to be involved in conversation d/t patient already being followed by palliative care. Patient resting in stretcher in NAD with respirations even and unlabored. All needs and concerns managed. Patient rounding complete, call bell in reach, bed locked and in lowest position, and patient belongings at bedside within reach of patient.  Patient updated on plan of care. Will continue to monitor.

## 2017-07-28 NOTE — Unmapped (Signed)
[facility]  Emergency Department Provider Note      ED Clinical Impression     Final diagnoses:   Cellulitis of lower extremity, unspecified laterality (Primary)   Urinary tract infection with hematuria, site unspecified   Pneumonia due to infectious organism, unspecified laterality, unspecified part of lung   Altered mental status, unspecified altered mental status type     Initial Impression, ED Course, Assessment and Plan     Impression: Carolyn Andersen is a 77 y.o. female with a PMH of HTN, DM, PVD, and PCNSL s/p WBRT who presents to the ED via EMS with alerted mental status this evening. Son also reports worsening BLE redness over the last 2-3 days.     On exam, she is afebrile. Hypertensive. HR 106, SpO2 92% on 2L O2 upon arrival. On exam, patient is delirious, not responding to questions, mumbling but protecting airway. Appears to have a non-focal neurological exam. No signs of trauma. Diffuse redness and swelling of distal BLE with weeping and warmth to touch ,L>R (see picture below). Abdomen soft, non-tender. Lungs clear.     Differential includes sepsis vs ACS vs worsening lymphoma vs urinary tract infection vs pneumonia vs electrolyte abnormality.     Plan for basic labs, lipase, troponin, tox screen, blood gas, UA, XR chest, CT head, and EKG. Will give vancomycin, clindamycin, cefemine, and fluids.     9:17 PM   WBC 15.0.     9:55 PM   UA shows 122 WBC, 10 RBC, large leukocyte esterase.     11:01 PM   Xr Chest shows:   -Patient's chin obscures the lung apices.   -Lungs well-inflated. Prominence of the pulmonary lung markings bilaterally similar to prior likely secondary to chronic lung disease. No overt pulmonary edema.   -There is a nodular opacity in the left lung base which is new compared to prior, may represent a mass or less likely developing infection. -Probable small left pleural effusion. No right effusion. No pneumothorax.   -Cardiomediastinal silhouette is within normal limits.     Ct Head shows no acute intracranial process.      Patient signed out to Dr. Colon Branch due to shift change. I discussed her care with the MED E resident. MED E at bedside. Med E resident and I had a long conversation with the son who would like to pursue palliative measures.     Additional Medical Decision Making     I independently visualized the EKG tracing.   I independently visualized the radiology images.   I reviewed the patient's prior medical records.   I discussed the case with the consultant, admitting provider, radiologist, ED pharmacist etc.   Additional history obtained from son as detailed in my note.     Any labs and radiology results that were available during my care of the patient were independently reviewed by me and considered in my medical decision making.    Portions of this record have been created using Scientist, clinical (histocompatibility and immunogenetics). Dictation errors have been sought, but may not have been identified and corrected.  ____________________________________________    I have reviewed the triage vital signs and the nursing notes.     History     Chief Complaint  Altered Mental Status      HPI   Carolyn Andersen is a 77 y.o. female with a PMH of HTN, DM, PVD, and PCNSL s/p WBRT on oral chemo who presents to the ED via EMS with alerted mental status.  Son (her POA) reports patient was having difficulty getting herself off of the toilet this afternoon. Patient was standing above the toilet for about 15 minutes prior to family going in to help her into her chair. Denies hitting head or falling. Afterwards, patient began complaining of generalized body aches, generalized weakness and was less responsive than usual. Son states patient was having difficulty keeping her eyes open.  At baseline, patient is able to make simple meals for herself, move throughout the house, and use the restroom on her own (overall mobility very limited). Her memory is impaired at baseline but she is typically able to speak. Son states patient was behaving at baseline prior to this today. Patient was dry heaving prior to EMS arrival but denies vomiting. Denies cough.     Son also reports bilateral lower extremity swelling. He states swelling is mildly improved today but she has been having worsening redness and drainage from her BLE (L>R) over the past 2 days. Patient has a home Geneticist, molecular. Denies recent medication changes. Endorses history of UTIs, last was 3-4 months ago. Denies known fever, dysuria, or hematuria. Denies cough, URI, or sick contacts.      History limited 2/2 patient's mental status.     Past Medical History:   Diagnosis Date   ??? Asthma    ??? Diabetes mellitus (CMS-HCC)    ??? Granuloma annulare    ??? High cholesterol    ??? Hypertension    ??? Myelodysplastic disease (CMS-HCC)    ??? PVD (peripheral vascular disease) (CMS-HCC)    ??? Seasonal allergies        Patient Active Problem List   Diagnosis   ??? Primary CNS lymphoma (CMS-HCC)   ??? Myelodysplastic disease (CMS-HCC)   ??? Hypertension   ??? Diabetes mellitus (CMS-HCC)   ??? Chronic left shoulder pain   ??? Arthritis   ??? Asthma without status asthmaticus   ??? Glaucoma (increased eye pressure)   ??? PVD (peripheral vascular disease) with claudication (CMS-HCC)       Past Surgical History:   Procedure Laterality Date   ??? DILATION AND CURETTAGE OF UTERUS     ??? EYE SURGERY     ??? SHOULDER ARTHROSCOPY     ??? TUBAL LIGATION         No current facility-administered medications for this encounter.     Current Outpatient Medications:   ???  aspirin (ECOTRIN) 81 MG tablet, Take 81 mg by mouth daily., Disp: , Rfl:   ???  atenolol (TENORMIN) 25 MG tablet, Take 25 mg by mouth daily. , Disp: , Rfl:   ???  atorvastatin (LIPITOR) 40 MG tablet, Take 1 tablet (40 mg total) by mouth daily., Disp: 30 tablet, Rfl: 0  ???  brimonidine (ALPHAGAN) 0.2 % ophthalmic solution, Apply to eye., Disp: , Rfl:   ???  cetirizine (ZYRTEC) 10 MG tablet, Take 10 mg by mouth daily., Disp: , Rfl:   ???  cilostazol (PLETAL) 100 MG tablet, Take 100 mg by mouth Two (2) times a day., Disp: , Rfl:   ???  dexamethasone (DECADRON) 4 MG tablet, Take 8 mg by mouth., Disp: , Rfl:   ???  docusate sodium (COLACE) 100 MG capsule, Take 100 mg by mouth nightly as needed. , Disp: , Rfl:   ???  fluticasone (FLOVENT HFA) 220 mcg/actuation inhaler, Inhale 2 puffs daily. , Disp: , Rfl:   ???  furosemide (LASIX) 20 MG tablet, Take by mouth., Disp: , Rfl:   ???  glucosamine-chondroit-vit  C-Mn (GLUCOSAMINE CHONDROITIN MAXSTR) 500-400 mg cap, Take 1 capsule by mouth daily at 0600. , Disp: , Rfl:   ???  ibrutinib (IMBRUVICA) 560 mg tablet, Take 1 tablet (560 mg total) by mouth daily., Disp: 28 tablet, Rfl: 5  ???  latanoprost (XALATAN) 0.005 % ophthalmic solution, Administer 1 drop to both eyes nightly. , Disp: , Rfl:   ???  losartan (COZAAR) 100 MG tablet, Take 100 mg by mouth., Disp: , Rfl:   ???  mometasone (ASMANEX TWISTHALER) 220 mcg (30 doses) AePB, Inhale 1 puff., Disp: , Rfl:   ???  montelukast (SINGULAIR) 10 mg tablet, Take 10 mg by mouth nightly. , Disp: , Rfl:   ???  MULTIVIT-MIN/FA/LUTEIN/ZEAXANT (ICAPS MV ORAL), Take 1 capsule by mouth daily. , Disp: , Rfl:   ???  senna (SENOKOT) 8.6 mg tablet, Take 2 tablets by mouth., Disp: , Rfl:   ???  timolol (BETIMOL) 0.5 % ophthalmic solution, Administer 1 drop to both eyes Two (2) times a day., Disp: , Rfl:     Allergies  Other; Sulfasalazine; Decongestant (brompheniramine); Estrogens; Iodine and iodide containing products; Metoprolol; Pseudoephedrine hcl; Statins-hmg-coa reductase inhibitors; Sulfa (sulfonamide antibiotics); Crab; and Shellfish containing products    Family History   Problem Relation Age of Onset   ??? Coronary artery disease Father    ??? Cancer Mother        Social History  Social History     Tobacco Use   ??? Smoking status: Former Smoker     Packs/day: 2.00     Years: 45.00     Pack years: 90.00     Types: Cigarettes     Last attempt to quit: 12/23/2014     Years since quitting: 2.5   ??? Smokeless tobacco: Never Used   Substance Use Topics ??? Alcohol use: No     Alcohol/week: 0.0 oz   ??? Drug use: No       Review of Systems:  Unable to obtain 2/2 to patient's altered mental status.     Physical Exam     ED Triage Vitals   Enc Vitals Group      BP 07/27/17 2016 167/107      Heart Rate 07/27/17 2021 106      SpO2 Pulse --       Resp 07/27/17 2021 26      Temp 07/27/17 2021 37.4 ??C (99.4 ??F)      Temp Source 07/27/17 2021 Oral      SpO2 07/27/17 2021 92 %     Constitutional: Minimally responsive. Delirious. Not responsive to questions.   ENT       Head: Normocephalic and atraumatic.       Nose: No congestion.       Mouth/Throat: Mucous membranes are dry.        Neck: No stridor.  Hematological/Lymphatic/Immunilogical: No cervical lymphadenopathy.  Cardiovascular: Tachycardic to 110s, regular rhythm. Normal and symmetric distal pulses are present in all extremities.  Respiratory: Mild increased respiratory effort. Breath sounds are normal. New 4 liters O2 requirement.   Gastrointestinal: Soft and nontender. There is no CVA tenderness.  Musculoskeletal: Diffuse redness and swelling of distal BLE with weeping and warmth to touch (L>R).   Neurologic: Appears to have a non-focal neurological exam.   Skin: Bilateral lower extremity redness, swelling and warmth with diffuse weeping of her skin.     EKG     Sinus tachycardia with nonspecific ST and T wave abnormality.     Radiology  Xr Chest 1 View Portable    Result Date: 07/27/2017  EXAM: CHEST ONE VIEW DATE: 07/27/2017 ACCESSION: [id number] UN DICTATED: 07/27/2017 10:38 PM INTERPRETATION LOCATION: [facility] CLINICAL INDICATION: 77 years old Female with ALTERED MENTAL STATUS- Please evaluate for PNA-  COMPARISON: CXR 11/20/2014 TECHNIQUE: AP semi upright view of the chest.     -Patient's chin obscures the lung apices. -Lungs well-inflated. Prominence of the pulmonary lung markings bilaterally similar to prior likely secondary to chronic lung disease. No overt pulmonary edema. -There is a nodular opacity in the left lung base which is new compared to prior, may represent a mass or less likely developing infection. -Probable small left pleural effusion. No right effusion. No pneumothorax. -Cardiomediastinal silhouette is within normal limits.     Ct Head Wo Contrast    Result Date: 07/27/2017  EXAM: Computed tomography, head or brain without contrast material. DATE: 07/27/2017 10:15 PM ACCESSION: [id number] UN DICTATED: 07/27/2017 10:23 PM INTERPRETATION LOCATION: [facility] CLINICAL INDICATION: 77 years old Female with AMS. Prior abnormal MRI with concern for lymphoma.-  COMPARISON: MR brain 06/29/2017 TECHNIQUE: Axial CT images of the head  from skull base to vertex without contrast. FINDINGS: Previously identified foci of enhancement along the splenium and corpus callosum on most recent MR are not well-visualized by CT. No evidence of mass lesion or midline shift. No acute infarct or intracranial hemorrhage is identified. Areas of hypoattenuation within the periventricular and subcortical white matter likely correspond to previously identified increased T2/FLAIR signal and appear overall unchanged, possibly posttreatment changes. Moderately advanced global cerebral atrophy. Basilar cisterns are patent. Diffuse circumferential mucosal wall thickening throughout the sinuses.     No acute intracranial process. Previously identified foci of enhancement along the splenium and corpus callosum on most recent MR are not well-visualized by CT. Areas of hypoattenuation within the periventricular and subcortical white matter likely correspond to previously identified increased T2/FLAIR signal and appear overall unchanged, possibly posttreatment changes.        I independently visualized these images.    Procedures     Procedure(s) performed: None.    Documentation assistance was provided by Jobe Marker, Scribe, on Jul 27, 2017 at 8:13 PM for Annia Belt, MD.     Documentation assistance was provided by the scribe in my presence.  The documentation recorded by the scribe has been reviewed by me and accurately reflects the services I personally performed.         Francis Gaines, MD  Resident  07/28/17 [phone]

## 2017-07-28 NOTE — Unmapped (Signed)
Patient returned from CT Scan  Transported by Radiology  How tranported Stretcher  Cardiac Monitor yes

## 2017-07-28 NOTE — Unmapped (Signed)
Patient transported to CT Scan  Transported by Radiology  How tranported Stretcher  Cardiac Monitor yes

## 2017-07-28 NOTE — Unmapped (Signed)
Patients son states that he believes she is having more difficulty breathing. Patient remains >97% on 2L O2. This RN suggested PRN breathing treatment to assist with respirations and son agrees. After patient placed on nebulizer treatment patients son more comfortable with transport to [facility]. Transport here to take patient to [facility].

## 2017-07-28 NOTE — Unmapped (Signed)
Care Management  Initial Transition Planning Assessment    Per H&P:  Carolyn Andersen is a 77 y.o. 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              General  Care Manager assessed the patient by : In person interview with family, Medical record review, Discussion with Clinical Care team  Orientation Level: Disoriented X4  Who provides care at home?: Family member     Type of Residence: Mailing Address:  [address]  [place] [address]  Contacts: Accompanied by: EMS personnel  Patient Phone Number: [phone] (home)         Medical Provider(s): Caryl Asp, FNP  Reason for Admission: Admitting Diagnosis:  No admission diagnoses are documented for this encounter.  Past Medical History:   has a past medical history of Asthma, Diabetes mellitus (CMS-HCC), Granuloma annulare, High cholesterol, Hypertension, Myelodysplastic disease (CMS-HCC), PVD (peripheral vascular disease) (CMS-HCC), and Seasonal allergies.  Past Surgical History:   has a past surgical history that includes Tubal ligation; Shoulder arthroscopy; Dilation and curettage of uterus; and Eye surgery.   Previous admit date: 11/19/2014    Primary Insurance- Payor: MEDICARE / Plan: MEDICARE PART A AND PART B / Product Type: *No Product type* /   Secondary Insurance ??? Secondary Insurance  TRICARE  Prescription Coverage ??? yes  Preferred Pharmacy -  [facility] - [place], [place] - [address]  [id number] - [place], [place] - [address]  [facility] - [place], [place] - [address]   [facility] - [place], [place] - [address]    Transportation home: Private vehicle  Level of function prior to admission: Independent    Contact/Decision Maker        Extended Emergency Contact Information  Primary Emergency Contact: [employer]  Address: [address]           [place], [place] [address] [place]  Home Phone: [phone]  Mobile Phone: [phone]  Relation: Son    Armed forces operational officer Next of Kin / Guardian / [place] / Advance Directives       Advance Directive (Medical Treatment)  Does patient have an advance directive covering medical treatment?: Patient has advance directive covering medical treatment, copy in chart.  Advance directive covering medical treatment not in Chart:: Copy requested from family  Reason patient does not have an advance directive covering medical treatment:: Patient does not wish to complete one at this time  Information provided on advance directive:: No  Patient requests assistance:: No    Advance Directive (Mental Health Treatment)  Does patient have an advance directive covering mental health treatment?: Patient does not have advance directive covering mental health treatment., Patient would not like information.  Reason patient does not have an advance directive covering mental health treatment:: Patient does not wish to complete one at this time.    Patient Information  Lives with: Family members, Children    Type of Residence: Private residence        Location/Detail: [facility]    Support Systems: Family Members, Children    Responsibilities/Dependents at home?: No    Home Care services in place prior to admission?:  No  Type of Home Care services in place prior to admission: Home health (specify)     Equipment Currently Used at Home: other (see comments), commode(hospital bed and bedside commode, does not have O2)       Currently receiving outpatient dialysis?: No       Financial Information       Need for financial assistance?: No       Social Determinants of Health  Social Determinants of Health were addressed in provider documentation.  Please refer to patient history.    Discharge Needs Assessment  Concerns to be Addressed: discharge planning    Clinical Risk Factors: Principal Diagnosis: Cancer, Stroke, COPD, Heart Failure, AMI, Pneumonia, Joint Replacment, Functional Limitations    Barriers to taking medications: No    Prior overnight hospital stay or ED visit in last 90 days: No    Readmission Within the Last 30 Days: no previous admission in last 30 days    Anticipated Changes Related to Illness: none    Equipment Needed After Discharge: oxygen    Discharge Facility/Level of Care Needs: other (see comments)(on comfort care; likely d/c home on hospice)    Readmission  Risk of Unplanned Readmission Score: UNPLANNED READMISSION SCORE: 13%  Readmitted Within the Last 30 Days?   Patient at risk for readmission?: Yes    Discharge Plan  Screen findings are: Care Manager reviewed the plan of the patient's care with the Multidisciplinary Team. No discharge planning needs identified at this time. Care Manager will continue to manage plan and monitor patient's progress with the team.    Expected Discharge Date: 07/28/17    Patient and/or family were provided with choice of facilities / services that are available and appropriate to meet post hospital care needs?: Yes   List choices in order highest to lowest preferred, if applicable. : son, POA given list of hospice agencies that service [place]    Initial Assessment complete?: Yes

## 2017-07-28 NOTE — Unmapped (Signed)
Pt admitted around 0200 with increased AMS and somnolence. Pt A&O to self only, appears to understand some, but is unable to follow most commands. Pt able to say no when asked if she is in pain, though with movement she does grimace and groan. Admission completed with assistance from pt's son, who is her HCPOA. Comfort measures ordered, though vital signs also ordered.  Pt has been tachycardic, tachypnic, and febrile to 38.9 since arrival. O2 saturation stable on 2 L nasal cannula. Pt unable to tolerate PO meds at this time. Ice packs applied for comfort as well as administration of  tylenol suppository. BLE swollen, erythematous, weeping, and warm to touch. Son remains at the bedside and wishes for mom to be made as comfortable as possible. Will CTM and notify team of any changes noted.     Problem: Adult Inpatient Plan of Care  Goal: Plan of Care Review  Outcome: Patient on Comfort Care  Goal: Patient-Specific Goal (Individualization)  Outcome: Patient on Comfort Care  Goal: Absence of Hospital-Acquired Illness or Injury  Outcome: Patient on Comfort Care  Goal: Optimal Comfort and Wellbeing  Outcome: Patient on Comfort Care  Goal: Readiness for Transition of Care  Outcome: Patient on Comfort Care  Goal: Rounds/Family Conference  Outcome: Patient on Comfort Care     Problem: Skin Injury Risk Increased  Goal: Skin Health and Integrity  Outcome: Patient on Comfort Care     Problem: Self-Care Deficit  Goal: Improved Ability to Complete Activities of Daily Living  Outcome: Patient on Comfort Care     Problem: Fall Injury Risk  Goal: Absence of Fall and Fall-Related Injury  Outcome: Patient on Comfort Care

## 2017-07-28 NOTE — Unmapped (Signed)
Patient resting in stretcher in NAD with respirations even and unlabored. All needs and concerns managed. No change in previous assessment. Patient rounding complete, call bell in reach, bed locked and in lowest position, and patient belongings at bedside within reach of patient.  Patient updated on plan of care. Will continue to monitor.

## 2017-07-29 LAB — CBC W/ AUTO DIFF
BASOPHILS RELATIVE PERCENT: 0.1 %
EOSINOPHILS ABSOLUTE COUNT: 0 10*9/L (ref 0.0–0.4)
EOSINOPHILS RELATIVE PERCENT: 0.6 %
HEMATOCRIT: 26.2 % — ABNORMAL LOW (ref 36.0–46.0)
HEMOGLOBIN: 8.6 g/dL — ABNORMAL LOW (ref 12.0–16.0)
LYMPHOCYTES ABSOLUTE COUNT: 0.3 10*9/L — ABNORMAL LOW (ref 1.5–5.0)
LYMPHOCYTES RELATIVE PERCENT: 3.8 %
MEAN CORPUSCULAR HEMOGLOBIN CONC: 32.9 g/dL (ref 31.0–37.0)
MEAN CORPUSCULAR VOLUME: 92.4 fL (ref 80.0–100.0)
MEAN PLATELET VOLUME: 11 fL — ABNORMAL HIGH (ref 7.0–10.0)
MONOCYTES ABSOLUTE COUNT: 0.2 10*9/L (ref 0.2–0.8)
NEUTROPHILS ABSOLUTE COUNT: 5.9 10*9/L (ref 2.0–7.5)
NEUTROPHILS RELATIVE PERCENT: 91.5 %
PLATELET COUNT: 100 10*9/L — ABNORMAL LOW (ref 150–440)
RED BLOOD CELL COUNT: 2.83 10*12/L — ABNORMAL LOW (ref 4.00–5.20)
RED CELL DISTRIBUTION WIDTH: 17.3 % — ABNORMAL HIGH (ref 12.0–15.0)
WBC ADJUSTED: 6.4 10*9/L (ref 4.5–11.0)

## 2017-07-29 LAB — BASIC METABOLIC PANEL
ANION GAP: 4 mmol/L — ABNORMAL LOW (ref 9–15)
BLOOD UREA NITROGEN: 24 mg/dL — ABNORMAL HIGH (ref 7–21)
BUN / CREAT RATIO: 34
CALCIUM: 8.4 mg/dL — ABNORMAL LOW (ref 8.5–10.2)
CHLORIDE: 112 mmol/L — ABNORMAL HIGH (ref 98–107)
CO2: 22 mmol/L (ref 22.0–30.0)
EGFR MDRD AF AMER: >=60 mL/min/{1.73_m2} (ref >=60)
GLUCOSE RANDOM: 140 mg/dL (ref 65–179)
POTASSIUM: 3.5 mmol/L (ref 3.5–5.0)

## 2017-07-29 LAB — POTASSIUM: Potassium:SCnc:Pt:Ser/Plas:Qn:: 3.5

## 2017-07-29 LAB — MEAN PLATELET VOLUME: Lab: 11 — ABNORMAL HIGH

## 2017-07-29 NOTE — Unmapped (Signed)
E1 Daily Progress Note    Interval history: NAEON. Patient febrile and received IV tylenol. Treatment plan has changed abruptly from yesterday. Will put hospice on hold for now and treat her aerococcus UTI and strep bacteremia. Have started IV cefepime.     Assessment/Plan:    Principal Problem:    Bacteremia due to Streptococcus  Active Problems:    Primary CNS lymphoma (CMS-HCC)    Myelodysplastic disease (CMS-HCC)    Hypertension    Chronic left shoulder pain    Glaucoma (increased eye pressure)    Acute cystitis without hematuria    Somnolence  Resolved Problems:    * No resolved hospital problems. *      ROME SCHLAUCH is a 77 y.o. female that presented to [facility] with Bacteremia due to Streptococcus.    Streptococcus dysgalactiae bacteremia BCx from 5/3 growing strep dysgalactiae. Sensitivities pending. Initially treated in ED with vanc, cefepime, clindamycin. On arrival to 4-onc, patient's son expressed desire to pursue only comfort-based care with ultimate transition to hospice, so abx were not continued. Patient was febrile overnight while not being treated. Ethics consulted but still waiting to hear back. Upon further discussion on 5/5, patient's son is now electing to treat this infection (as well as her UTI described below).   - Start IV cefepime 2g q8h (5/5 - )  - Sensitivities pending  - Will repeat BCx  - Ethics consulted. Will follow up with them on 5/6    Aerococcus urinae UTI: UA on 5/3 with large leuk esterase and >120 WBCs. UCx growing aerococcus urinae.  Received vancomycin, cefepime, clindamycin in the ED. As above, abx were initially held when patient's son decided to pursue hospice. Now starting treatment on 5/5.   - Cefepime as above  - Ethics consult as above    AMS - stable: due to above-listed infections. 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  Incurable, relapsed now on Ibrutinib since 08/19/16 with relatively stable disease since initiation.  Last MRI April 2019 with stable disease with plan to continue ibrutinib 560 mg daily and repeat MRI in 3 months.   -Holding ibrutinib given shifting goals of care per patient's healthcare power of attorney    HTN: Holding home BP meds while bacteremic    FEN/GI/PPX:  -- Diet: Regular  -- IVF: not indicated  -- DVT Ppx: none indicated as patient is on comfort-based therapy  -- GI: not indicated  -- Lines: PIV  -- Code status: DNR/DNI    ___________________________________________________________________  Labs/Studies:  Labs and Studies from the last 24hrs per EMR and Reviewed    Objective:  Temp:  [38.3 ??C-40.1 ??C] 38.3 ??C  Heart Rate:  [101-114] 101  Resp:  [22-26] 22  BP: (147-151)/(60-64) 147/64  SpO2:  [99 %-100 %] 99 %    GEN: Elderly female in no acute distress. Sleeping.  HEENT: EOMI  CV: No JVD. Tachycardic  RESP: Normal work of breathing on nasal cannula  EXT: bilateral venous stasis dermatitis

## 2017-07-29 NOTE — Unmapped (Signed)
Pt in bed with labored breathing and somnolent. IV morphine given x2 during shift and pt is now calm and sleeping. Temperature was noted at beginning of shift and MD was paged. Pt on comfort care and son at bedside refused tylenol for fever.

## 2017-07-29 NOTE — Unmapped (Signed)
Pt has been somnolent for majority of shift. Comfort measures in place with q. Shift VS checks. Pt tachycardic, tachypnic, and febrile to 40.1 with VS check. Pt's son declined tylenol suppository but was amenable to IV tylenol. IV tylenol administered-temperature re-checked once and found to be 38.7. Pt has been a little more restless tonight with occasional moans and clenched hands. Morphine administered x4 with effective results of helping pt rest comfortably. Pt is currently sleeping in NAD with son at the bedside. Son hoping for pt to go home with hospice today, though he does have some concerns about transport making her uncomfortable and wants to ensure what will be best and most comfortable for her. Will CTM.     Problem: Adult Inpatient Plan of Care  Goal: Plan of Care Review  07/29/2017 0558 by Melburn Hake, RN  Outcome: Patient on Comfort Care  Flowsheets (Taken 07/29/2017 [phone])  Progress: declining  Plan of Care Reviewed With: son;durable power of attorney  07/29/2017 0558 by Melburn Hake, RN    Goal: Patient-Specific Goal (Individualization)  07/29/2017 0558 by Melburn Hake, RN  Outcome: Patient on Comfort Care    Goal: Absence of Hospital-Acquired Illness or Injury  07/29/2017 0558 by Melburn Hake, RN  Outcome: Patient on Comfort Care    Goal: Optimal Comfort and Wellbeing  07/29/2017 0558 by Melburn Hake, RN  Outcome: Patient on Comfort Care    Goal: Readiness for Transition of Care  07/29/2017 0558 by Melburn Hake, RN  Outcome: Patient on Comfort Care    Goal: Rounds/Family Conference  07/29/2017 0558 by Melburn Hake, RN  Outcome: Patient on Comfort Care     Problem: Skin Injury Risk Increased  Goal: Skin Health and Integrity  07/29/2017 0558 by Melburn Hake, RN  Outcome: Patient on Comfort Care     Problem: Self-Care Deficit  Goal: Improved Ability to Complete Activities of Daily Living  07/29/2017 0558 by Melburn Hake, RN  Outcome: Patient on Comfort Care    Problem: Fall Injury Risk  Goal: Absence of Fall and Fall-Related Injury  07/29/2017 0558 by Melburn Hake, RN  Outcome: Patient on Comfort Care    Problem: Diabetes Comorbidity  Goal: Blood Glucose Level Within Desired Range  07/29/2017 0558 by Melburn Hake, RN  Outcome: Patient on Comfort Care    Problem: Hypertension Comorbidity  Goal: Blood Pressure in Desired Range  07/29/2017 0558 by Melburn Hake, RN  Outcome: Patient on Comfort Care       Problem: Pain Chronic (Persistent) (Comorbidity Management)  Goal: Acceptable Pain Control and Functional Ability  07/29/2017 0558 by Melburn Hake, RN  Outcome: Patient on Comfort Care

## 2017-07-30 LAB — CBC W/ AUTO DIFF
BASOPHILS ABSOLUTE COUNT: 0 10*9/L (ref 0.0–0.1)
BASOPHILS RELATIVE PERCENT: 0.3 %
EOSINOPHILS ABSOLUTE COUNT: 0 10*9/L (ref 0.0–0.4)
EOSINOPHILS RELATIVE PERCENT: 0.4 %
HEMATOCRIT: 26.4 % — ABNORMAL LOW (ref 36.0–46.0)
HEMOGLOBIN: 8.2 g/dL — ABNORMAL LOW (ref 12.0–16.0)
LARGE UNSTAINED CELLS: 2 % (ref 0–4)
LYMPHOCYTES ABSOLUTE COUNT: 0.3 10*9/L — ABNORMAL LOW (ref 1.5–5.0)
LYMPHOCYTES RELATIVE PERCENT: 5.9 %
MEAN CORPUSCULAR HEMOGLOBIN: 28.9 pg (ref 26.0–34.0)
MEAN CORPUSCULAR VOLUME: 92.9 fL (ref 80.0–100.0)
MEAN PLATELET VOLUME: 11.9 fL — ABNORMAL HIGH (ref 7.0–10.0)
MONOCYTES ABSOLUTE COUNT: 0.4 10*9/L (ref 0.2–0.8)
MONOCYTES RELATIVE PERCENT: 7.4 %
NEUTROPHILS ABSOLUTE COUNT: 4.8 10*9/L (ref 2.0–7.5)
NEUTROPHILS RELATIVE PERCENT: 84.6 %
PLATELET COUNT: 89 10*9/L — ABNORMAL LOW (ref 150–440)
RED CELL DISTRIBUTION WIDTH: 17.3 % — ABNORMAL HIGH (ref 12.0–15.0)
WBC ADJUSTED: 5.7 10*9/L (ref 4.5–11.0)

## 2017-07-30 LAB — BASIC METABOLIC PANEL
ANION GAP: 8 mmol/L — ABNORMAL LOW (ref 9–15)
BLOOD UREA NITROGEN: 26 mg/dL — ABNORMAL HIGH (ref 7–21)
BUN / CREAT RATIO: 43
CALCIUM: 8.7 mg/dL (ref 8.5–10.2)
CHLORIDE: 112 mmol/L — ABNORMAL HIGH (ref 98–107)
CO2: 23 mmol/L (ref 22.0–30.0)
CREATININE: 0.6 mg/dL (ref 0.60–1.00)
EGFR MDRD NON AF AMER: >=60 mL/min/{1.73_m2} (ref >=60)
GLUCOSE RANDOM: 134 mg/dL (ref 65–179)
POTASSIUM: 3.4 mmol/L — ABNORMAL LOW (ref 3.5–5.0)
SODIUM: 143 mmol/L (ref 135–145)

## 2017-07-30 LAB — EGFR MDRD AF AMER: Glomerular filtration rate/1.73 sq M.predicted.black:ArVRat:Pt:Ser/Plas/Bld:Qn:Creatinine-based formula (MDRD): >=60

## 2017-07-30 LAB — MEAN CORPUSCULAR HEMOGLOBIN: Lab: 28.9

## 2017-07-30 NOTE — Unmapped (Signed)
E1 Daily Progress Note    Interval history: NAEON. Afebrile with stable vitals. Doing remarkably well after beginning cefepime yesterday. Awake this morning and able to answer basic questions although understandably a little disoriented. Will continue IV Abx today and make sure patient and family informed of importance of completing a MOST form. Repeat BCx drawn.     Assessment/Plan:    Principal Problem:    Bacteremia due to Streptococcus  Active Problems:    Primary CNS lymphoma (CMS-HCC)    Myelodysplastic disease (CMS-HCC)    Hypertension    Chronic left shoulder pain    Glaucoma (increased eye pressure)    Acute cystitis without hematuria    Somnolence  Resolved Problems:    * No resolved hospital problems. *      Carolyn Andersen is a 77 y.o. female that presented to [facility] with Bacteremia due to Streptococcus.    Streptococcus dysgalactiae bacteremia BCx from 5/3 growing strep dysgalactiae. Sensitivities pending. Initially treated in ED with vanc, cefepime, clindamycin. On arrival to 4-onc, patient's son expressed desire to pursue only comfort-based care with ultimate transition to hospice, so abx were not continued. Patient was febrile overnight while not being treated. Ethics consulted but still waiting to hear back. Upon further discussion on 5/5, patient's son is now electing to treat this infection (as well as her UTI described below).   - Start IV cefepime 2g q8h (5/5 - ). May be able to switch to ceftriaxone, pending sensitivities.   - Sensitivities pending  - Follow up BCx from 5/6    Aerococcus urinae UTI: UA on 5/3 with large leuk esterase and >120 WBCs. UCx growing aerococcus urinae.  Received vancomycin, cefepime, clindamycin in the ED. As above, abx were initially held when patient's son decided to pursue hospice. Now starting treatment on 5/5.   - Cefepime as above    AMS - improving: due to above-listed infections. 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  Incurable, relapsed now on Ibrutinib since 08/19/16 with relatively stable disease since initiation.  Last MRI April 2019 with stable disease with plan to continue ibrutinib 560 mg daily and repeat MRI in 3 months.   -Holding ibrutinib while inpatient    HTN: Holding home BP meds while bacteremic    FEN/GI/PPX:  -- Diet: Regular  -- IVF: NS @ 100 cc/hr while not able to take PO   -- DVT Ppx: lovenox  -- GI: not indicated  -- Lines: PIV  -- Code status: DNR/DNI    ___________________________________________________________________  Labs/Studies:  Labs and Studies from the last 24hrs per EMR and Reviewed    Objective:  Temp:  [35.7 ??C-37.1 ??C] 35.7 ??C  Heart Rate:  [90-101] 94  Resp:  [18-24] 18  BP: (123-145)/(58-66) 137/63  SpO2:  [94 %-97 %] 94 %    GEN: Elderly female in no acute distress. Awake and answering questions  HEENT: EOMI  CV: No JVD. RRR  RESP: Normal work of breathing on nasal cannula  EXT: bilateral venous stasis dermatitis

## 2017-07-30 NOTE — Unmapped (Signed)
WOCN Consult Services                                                     Wound Evaluation: Lower Extremity     Reason for Consult:   - Lower Extremity Ulcer    Problem List:   Principal Problem:    Bacteremia due to Streptococcus  Active Problems:    Primary CNS lymphoma (CMS-HCC)    Myelodysplastic disease (CMS-HCC)    Hypertension    Chronic left shoulder pain    Glaucoma (increased eye pressure)    Acute cystitis without hematuria    Somnolence    Assessment:Carolyn Andersen is a 77 y.o. 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    We were asked to assess this patient regarding lower leg wounds. Patient at this time has dry flaky skin on both lower legs. It appears that she may have had significant edema in the legs previously which caused her legs to be weepy. There are two partial thickness open area on the right leg that are dry at this time. ??There is erythema noted both legs, likely inflammation.     At this time there is no weeping noted. Recommend hydrating both lower legs with Remedy skin moisturizer BID to manage dry skin. If legs start to weep again would recommend using ABDs to manage drainage secured by Kerlix.      Lab Results   Component Value Date    WBC 5.7 07/30/2017    HGB 8.2 (L) 07/30/2017    HCT 26.4 (L) 07/30/2017    A1C 5.2 02/21/2017    GLU 134 07/30/2017    POCGLU 154 11/15/2014    ALBUMIN 4.8 07/27/2017    PROT 7.2 07/27/2017     Lower Extremity Distal Pulses:   - Not assessed.    Protective Sensation Foot:   Not assessed.    Offloading: Elevate heels off bed  Left: Pillow  Right: Pillow    Teaching:  - Routine skin care  - Moisturizer    WOCN Recommendations:   - See nursing orders for wound care instructions.  - Contact WOCN with questions, concerns, or wound deterioration.    Topical Therapy/Interventions:   - Moisturizing lotion    WOCN Follow Up:  - We will sign off at this time. Please reconsult the WOCN Consult Service if you would like [place] to participate further in the care of this patient.     Plan of Care Discussed With:   - Patient  - RN Shanda Bumps    Supplies Ordered: No- available on unit    Workup Time:   30 minutes     Jeanelle Malling RN BS [facility]  (Pager)- [phone]

## 2017-07-30 NOTE — Unmapped (Signed)
Pt on comfort care plan at the beginning of this shift, oral care provided, foley care, iv pain medicines given prn; after rounds with MDs and son, decision made to start cefepime to treat known infection; several hours after 1st dose cefepime, pt began to become less somnolent, was able to say some words to RN and family indicating she wanted mouth care or ice chips; family at bedside very happy and surprised at patient's improving mental status; encouragement and support provided throughout shift to family and patient, cont to monitor

## 2017-07-30 NOTE — Unmapped (Signed)
Pt remained stable during shift, afebrile, with VSS.  No complaints of pain or nausea during shift.  Continued to turn patient Q2 hrs.  Pt had one complaint of pain.  PRN pain medication given with good relief.  Will continue to monitor.

## 2017-07-31 DIAGNOSIS — R400 Somnolence: Principal | ICD-10-CM

## 2017-07-31 LAB — CBC W/ AUTO DIFF
BASOPHILS ABSOLUTE COUNT: 0 10*9/L (ref 0.0–0.1)
BASOPHILS RELATIVE PERCENT: 0.2 %
EOSINOPHILS ABSOLUTE COUNT: 0 10*9/L (ref 0.0–0.4)
EOSINOPHILS RELATIVE PERCENT: 0.6 %
HEMOGLOBIN: 7.9 g/dL — ABNORMAL LOW (ref 12.0–16.0)
LARGE UNSTAINED CELLS: 3 % (ref 0–4)
LYMPHOCYTES ABSOLUTE COUNT: 0.5 10*9/L — ABNORMAL LOW (ref 1.5–5.0)
MEAN CORPUSCULAR HEMOGLOBIN CONC: 31.2 g/dL (ref 31.0–37.0)
MEAN CORPUSCULAR HEMOGLOBIN: 29.2 pg (ref 26.0–34.0)
MEAN CORPUSCULAR VOLUME: 93.6 fL (ref 80.0–100.0)
MEAN PLATELET VOLUME: 12 fL — ABNORMAL HIGH (ref 7.0–10.0)
MONOCYTES ABSOLUTE COUNT: 0.4 10*9/L (ref 0.2–0.8)
MONOCYTES RELATIVE PERCENT: 6.5 %
NEUTROPHILS ABSOLUTE COUNT: 4.6 10*9/L (ref 2.0–7.5)
NEUTROPHILS RELATIVE PERCENT: 81.1 %
PLATELET COUNT: 87 10*9/L — ABNORMAL LOW (ref 150–440)
RED BLOOD CELL COUNT: 2.7 10*12/L — ABNORMAL LOW (ref 4.00–5.20)
RED CELL DISTRIBUTION WIDTH: 17.2 % — ABNORMAL HIGH (ref 12.0–15.0)
WBC ADJUSTED: 5.7 10*9/L (ref 4.5–11.0)

## 2017-07-31 LAB — BASIC METABOLIC PANEL
ANION GAP: 10 mmol/L (ref 9–15)
BLOOD UREA NITROGEN: 22 mg/dL — ABNORMAL HIGH (ref 7–21)
BUN / CREAT RATIO: 42
CALCIUM: 8.4 mg/dL — ABNORMAL LOW (ref 8.5–10.2)
CO2: 22 mmol/L (ref 22.0–30.0)
CREATININE: 0.52 mg/dL — ABNORMAL LOW (ref 0.60–1.00)
EGFR MDRD AF AMER: >=60 mL/min/{1.73_m2} (ref >=60)
EGFR MDRD NON AF AMER: >=60 mL/min/{1.73_m2} (ref >=60)
GLUCOSE RANDOM: 117 mg/dL (ref 65–179)
POTASSIUM: 3.6 mmol/L (ref 3.5–5.0)
SODIUM: 146 mmol/L — ABNORMAL HIGH (ref 135–145)

## 2017-07-31 LAB — GLUCOSE RANDOM: Glucose:MCnc:Pt:Ser/Plas:Qn:: 117

## 2017-07-31 LAB — HEMATOCRIT: Lab: 25.3 — ABNORMAL LOW

## 2017-07-31 NOTE — Unmapped (Signed)
Patient alert, drowsy, oriented x4. Able to follow commands. Complains of generalized aching but not able to verbalize on 1-10 scale. Patient saturating well on 2L nasal cannula. No other complaints. Will continue to monitor.

## 2017-07-31 NOTE — Unmapped (Signed)
PHYSICAL THERAPY  Evaluation (07/31/17 0935)     Patient Name:  Carolyn Andersen       Medical Record Number: [id number]   Date of Birth: Aug 06, 1940  Sex: Female            Treatment Diagnosis: Generalized deconditioning, impaired ability to perform transfers    ASSESSMENT    Patient is a 77 y.o. 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 Patient presents today with deficits in strength, endurance, and bed mobility, unable to perform sit to/from stand transfer this date, and as such, will benefit from skilled acute PT intervention to address these impairments and progress with mobility while in house. Based on the AM-PAC five item raw score of 7/20, the patient is considered to be 84.99% impaired with basic mobility. This indicates that the patient will benefit from PT f/u of 5x/week (low intensity) in order to safely maximize independence with functional mobility and to decrease caregiver burden.       Today's Interventions: AM-PAC raw score: 7/20; patient educated on role of PT, PT POC, PT goals, progression of mobility, performing pursed lip/deep breathing techniques, and falls risk. Patient performs seated exercises EOB with CGA for safety: LAQs, ankle pumps, UE flexion raises, x5 reps each. Patient requires increased time/effort to participate this session.     Activity Tolerance: Patient limited by fatigue    PLAN  Planned Frequency of Treatment:  1-2x per day for: 3-4x week      Planned Interventions: Balance activities;Diaphragmatic / Pursed-lip breathing;Education - Patient;Education - Family / caregiver;Endurance activities;Functional mobility;Gait training;Home exercise program;Neuromuscular re-education;Postural re-education;Self-care / Home training;Therapeutic exercise;Therapeutic activity;Transfer training;Wheelchair training    Post-Discharge Physical Therapy Recommendations:  5x weekly;Low intensity        PT DME Recommendations: Defer to post acute     Goals:   Patient and Family Goals: None stated    Long Term Goal #1: TBD based on OOB mobility assessment       SHORT GOAL #1: Patient will perform supine to/from sit transition with min assist              Time Frame : 2 weeks  SHORT GOAL #2: Patient will complete OOB mobility assessment              Time Frame : 2 weeks  SHORT GOAL #3: Patient and/or patient's caregiver(s) will be independent with HEP              Time Frame : 2 weeks                                        Prognosis:  Fair  Barriers to Discharge: Endurance deficits;Functional strength deficits;Inability to safely perform ADLS  Positive Indicators: Caregiver support    SUBJECTIVE  Patient reports: Patient and RN agreeable to PT, I don't know what stops me per pt regarding declining standing  Current Functional Status: Patient received and ended session supine in bed with HOB elevated and pillow underneath bilateral LEs as well as to right hip, lines intact, needs within reach, bed alarm on, RN and NA aware.      Prior functional status: Prior to admission, patient reports ambulating limited distances with use of RW, and using the manual w/c for distances outside of the home. Patient reports having  an aide that comes 5 days per week to provide assistance for household tasks and for ADLs.   Equipment available at home: [employer];Wheelchair-manual    Past Medical History:   Diagnosis Date   ??? Asthma    ??? Diabetes mellitus (CMS-HCC)    ??? Granuloma annulare    ??? High cholesterol    ??? Hypertension    ??? Myelodysplastic disease (CMS-HCC)    ??? PVD (peripheral vascular disease) (CMS-HCC)    ??? Seasonal allergies     Social History     Tobacco Use   ??? Smoking status: Former Smoker     Packs/day: 2.00     Years: 45.00     Pack years: 90.00     Types: Cigarettes     Last attempt to quit: 12/23/2014     Years since quitting: 2.6   ??? Smokeless tobacco: Never Used Substance Use Topics   ??? Alcohol use: No     Alcohol/week: 0.0 oz      Past Surgical History:   Procedure Laterality Date   ??? DILATION AND CURETTAGE OF UTERUS     ??? EYE SURGERY     ??? SHOULDER ARTHROSCOPY     ??? TUBAL LIGATION      Family History   Problem Relation Age of Onset   ??? Coronary artery disease Father    ??? Cancer Mother         Allergies: Other; Sulfasalazine; Decongestant (brompheniramine); Estrogens; Iodine and iodide containing products; Metoprolol; Pseudoephedrine hcl; Statins-hmg-coa reductase inhibitors; Sulfa (sulfonamide antibiotics); Crab; and Shellfish containing products                Objective Findings              Precautions: Falls              Weight Bearing Status: Non- applicable              Required Braces or Orthoses: Non- applicable    Communication Preference: Verbal  Pain Comments: Patient reports being achy all over, does not provide numerical value and reports mobility does not increase pt's c/o discomfort. RN aware  Medical Tests / Procedures: Labs, orders, and imaging reviewed in Epic.   Equipment / Environment: Supplemental oxygen;Vascular access (PIV, TLC, Port-a-cath, PICC);Foley(2L O2 via [place])    At Rest: VSS per Epic     Orthostatics: Patient reports dizziness following initial supine to sit transition, symptoms resolve within ~1 min static sitting EOB and pt asymptomatic throughout remainder of session       Living environment: House  Lives With: Son;Family  Home Living: Two level home;Able to Live on main level with bedroom/bathroom;Ramped entrance;Garden tub;Walk-in shower             Cognition: A&O x3 (to person, time, and place), pt demonstrates difficulty with word finding, intermittently has difficulty following commands for tasks throughout session  Visual / Perception Status: Patient reports wearing glasses at baseline (not in room), able to accurately identify number of fingers ~3 feet in front of patient  Skin Inspection: Bilateral LE edema with weeping noted, bruising present on bilateral UEs    UE ROM: Bilateral shoulder flexion AROM to approx 90 degrees  UE Strength: Bilateral shoulder flexion: 3-/5  LE ROM: WFL  LE Strength: Bilateral hip flex: 3-/5                       Sensation: Patient reports decreased light touch sensation  to distal aspect of bilateral LEs  Balance: Patient sits unsupported EOB with CGA for safety         Bed Mobility: Patient transitions supine to/from sit with max assist x2 for bilateral LE management and for trunk control  Transfers: Unable to progress into standing this date, patient declines attempts due to reports of fatigue              Endurance: Impaired, patient reports generalized fatigue which limits participation and progression into standing    Eval Duration(PT): 30 Min.    Medical Staff Made Aware: RN Shanda Bumps informed of session outcome     I attest that I have reviewed the above information.  Signed: Fernanda Drum, PT  Filed 07/31/2017

## 2017-07-31 NOTE — Unmapped (Signed)
OCCUPATIONAL THERAPY  Evaluation (07/31/17 0955) with PT, Fernanda Drum    Patient Name:  Carolyn Andersen       Medical Record Number: [id number]   Date of Birth: 02-28-1941  Sex: Female          OT Treatment Diagnosis:  Weakness, decreased cognition    Assessment  Patient is a 77 y.o. 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 Pt presents to acute OT with weakness and decreased activity tolerance limiting ADL independence and functional transfer ability. Pt was able to participate in ADL at EOB but unable to participate in standing this date. She will benefit from skilled acute OT to maximize functional independence and safety. Based on the daily activity AM-PAC raw score of 12/24, the pt is considered to be 66.57% impaired with self care. This indicates post acute OT 5x/wk at discharge. After review of contributing co-mobidities and personal factors, clinical presentation and exam findings, patient demonstrates moderate complexity for evaluation and development of plan of care.         Today's Interventions: Adaptive equipment;Education - Patient;Home exercise program;ADL retraining;Balance activities;Bed mobility;Compensatory tech. training;Conservation;Functional mobility;Positioning;Transfer training;Therapeutic exercise;Functional cognition;Endurance activities;Education - Family / caregiver;Safety education;Range of motion    Activity Tolerance During Today's Session  Patient tolerated treatment well    Plan  Planned Frequency of Treatment:  1-2x per day for: 3-4x week       Planned Interventions:  Adaptive equipment;Education - Patient;ADL retraining;Balance activities;Bed mobility;Compensatory tech. training;Conservation;Functional mobility;Positioning;Range of motion;Functional cognition;Transfer training;Endurance activities;Education - Family / caregiver;Home exercise program;Safety education    Post-Discharge Occupational Therapy Recommendations:  OT Post Acute Discharge Recommendations: 5x weekly;Low intensity    ;    OT DME Recommendations: Defer to post acute    GOALS:   Patient and Family Goals: To get better     Long Term Goal #1: Pt will score 24/24 on AMPAC in 2 months        Short Term:  Pt will complete OOB functional transfer assessment    Time Frame : 2 weeks  Pt will complete lower body dressing with Min A and LRAD    Time Frame : 2 weeks  Pt will complete upper body dressing at EOB with setup A    Time Frame : 2 weeks                  Prognosis:  Good  Positive Indicators:  PLOF, motivation   Barriers to Discharge: Functional strength deficits;Endurance deficits;Inability to safely perform ADLS    Subjective  Current Status Pt left semi reclined in bed with all immediate needs met, RN aware, call bell in reach, bed alarm activated   Prior Functional Status Lives with son and DIL. Has hired caregiver 9-5 5x/wk who helps with IADL/ADL. Typically able to get around home with RW.     Medical Tests / Procedures: Reviewed all imaging      Patient / Caregiver reports: My thinking feels confused    Past Medical History:   Diagnosis Date   ??? Asthma    ??? Diabetes mellitus (CMS-HCC)    ??? Granuloma annulare    ??? High cholesterol    ??? Hypertension    ??? Myelodysplastic disease (CMS-HCC)    ???  PVD (peripheral vascular disease) (CMS-HCC)    ??? Seasonal allergies     Social History     Tobacco Use   ??? Smoking status: Former Smoker     Packs/day: 2.00     Years: 45.00     Pack years: 90.00     Types: Cigarettes     Last attempt to quit: 12/23/2014     Years since quitting: 2.6   ??? Smokeless tobacco: Never Used   Substance Use Topics   ??? Alcohol use: No     Alcohol/week: 0.0 oz Past Surgical History:   Procedure Laterality Date   ??? DILATION AND CURETTAGE OF UTERUS     ??? EYE SURGERY     ??? SHOULDER ARTHROSCOPY     ??? TUBAL LIGATION      Family History   Problem Relation Age of Onset   ??? Coronary artery disease Father    ??? Cancer Mother         Other; Sulfasalazine; Decongestant (brompheniramine); Estrogens; Iodine and iodide containing products; Metoprolol; Pseudoephedrine hcl; Statins-hmg-coa reductase inhibitors; Sulfa (sulfonamide antibiotics); Crab; and Shellfish containing products     Objective Findings  Precautions / Restrictions  Falls precautions    Weight Bearing  Non-applicable    Required Braces or Orthoses  Non-applicable    Communication Preference  Verbal    Pain  Pt denies pain     Equipment / Environment  Vascular access (PIV, TLC, Port-a-cath, PICC);Supplemental oxygen;Foley    Living Situation   Living environment: House   Lives With: Son;Family   Home Living: Two level home;Able to Live on main level with bedroom/bathroom;Ramped entrance;Garden tub;Walk-in shower   Equipment available at home: [employer];Wheelchair-manual            Cognition   Orientation Level:  Oriented x 4   Arousal/Alertness:  Appropriate responses to stimuli   Attention Span:  Appears intact   Memory:      Following Commands:  Follows multistep commands with repetition;Follows multistep commands with increased time   Safety Judgment:      Awareness of Errors:  Assistance required to identify errors made   Problem Solving:  Assistance required to identify errors made   Comments: Pt reports that she feels confused. Requires cueing to follow multistep commands     Vision / Perception  Vision: Wears glasses for reading only          Hand Function  Hand Dominance: R  Good B) grip     Skin Inspection   BLE weeping edema     ROM / Strength/Coordination  UE ROM/ Strength/ Coordination: Decreased B) SF 2/2 to prior surgeries. Elbow/wrist/hand WFL        Sensation:  Denies numbness to hands/feet Balance:   CGA EOB sitting balance     Mobility/Gait/Transfers:  max A x2 sup<>sit EOB. Unable to attempt standing 2.2 to fatigue. Max A x2 sit<>supine    ADL:     Grooming: Setup A for washing face at EOB  Dressing: LB: Total a for socks UB: Mod A   Eating: Setup A   Toileting: Total A        Vitals/ Orthostatics:     With Activity: NAD  Orthostatics: NAD    Interventions Performed During Today's Session: AMPAC: 12/24. EOB grooming activity, EOB exercises and balance activity. Pt education re: role of acute OT, POC, UE exercises, bed mobility techniques, benefits and importance of EOB/OOB activity with assist, reorientation  Eval Duration (OT): 30 Min.    Medical Staff Made Aware: RN aware    I attest that I have reviewed the above information.  Signed: Jaclyn Shaggy, OT  Filed 07/31/2017

## 2017-07-31 NOTE — Unmapped (Signed)
E1 Daily Progress Note    Attending Attestation:  I saw and evaluated the patient, and participated in the key portions of the service. I reviewed and edited the resident's note and agree with the findings and plan.     I was not present for any of the prior discussions with the son about antibioitcs. However, my understanding from talking to residents and Dr. Aviva Kluver is that the son seemed to be making decisions based on his medical understanding and taking good care of his mother. His sister, after a long illness, recently stopped all her care and died from bacteremia and it was a very peaceful death.    We will have SW see her to start discussing dispo plans. She will need IV Abx at home or a SNF.  We will also meet with her and her son now that she is more oriented to discuss her desires for ongoing treatment so that there is a good family understanding.    Carolyn Critchley, MD  Associate Professor  Heme/Onc    ===================================================    Interval history: NAEO. Alert and oriented to self, place, and time this am.    Assessment/Plan:    Principal Problem:    Bacteremia due to Streptococcus  Active Problems:    Primary CNS lymphoma (CMS-HCC)    Myelodysplastic disease (CMS-HCC)    Hypertension    Chronic left shoulder pain    Glaucoma (increased eye pressure)    Acute cystitis without hematuria    Somnolence  Resolved Problems:    * No resolved hospital problems. *      Carolyn Andersen is a 77 y.o. female that presented to [facility] with Bacteremia due to Streptococcus.    Streptococcus dysgalactiae bacteremia BCx from 5/3 growing strep dysgalactiae. Sensitivities pending. Initially treated in ED with vanc, cefepime, clindamycin. On arrival to 4-onc, patient's son expressed desire to pursue only comfort-based care with ultimate transition to hospice, so abx were not continued. Patient was febrile overnight while not being treated. Ethics consulted but still waiting to hear back. Upon further discussion on 5/5, patient's son is now electing to treat this infection (as well as her UTI described below).   - D/c cefepime, empirically narrow to ceftriaxone given pathogens are strep dysgalactiae and/or aerococcus  - Follow up BCx from 5/6 (NG @ 24h)    Aerococcus urinae UTI: UA on 5/3 with large leuk esterase and >120 WBCs. UCx growing aerococcus urinae.  Received vancomycin, cefepime, clindamycin in the ED. As above, abx were initially held when patient's son decided to pursue hospice. Now starting treatment on 5/5.   - Empirically narrow to ceftriaxone as above    AMS - resolved: due to above-listed infections. 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  Incurable, relapsed now on Ibrutinib since 08/19/16 with relatively stable disease since initiation.  Last MRI April 2019 with stable disease with plan to continue ibrutinib 560 mg daily and repeat MRI in 3 months.   -Holding ibrutinib while inpatient    HTN: Holding home BP meds while bacteremic    FEN/GI/PPX:  -- Diet: Regular  -- IVF: not indicated   -- DVT Ppx: lovenox  -- GI: not indicated  -- Lines: PIV  -- Code status: DNR/DNI    ___________________________________________________________________  Labs/Studies:  Labs and Studies from the last 24hrs per EMR and Reviewed    Objective:  Temp:  [35.7 ??C-37.2 ??C] 35.7 ??C  Heart Rate:  [90-112] 90  Resp:  [18-21] 21  BP: (107-178)/(58-78) 178/65  SpO2:  [91 %-100 %] 96 %    GEN: Elderly female lying in bed in no acute distress. A/Ox3  HEENT: EOMI  CV: No JVD. RRR  RESP: Normal WoB on nasal cannula  EXT: bilateral venous stasis dermatitis

## 2017-08-01 LAB — CBC W/ AUTO DIFF
BASOPHILS ABSOLUTE COUNT: 0 10*9/L (ref 0.0–0.1)
BASOPHILS RELATIVE PERCENT: 0.2 %
EOSINOPHILS ABSOLUTE COUNT: 0 10*9/L (ref 0.0–0.4)
HEMATOCRIT: 23.4 % — ABNORMAL LOW (ref 36.0–46.0)
HEMOGLOBIN: 7.4 g/dL — ABNORMAL LOW (ref 12.0–16.0)
LARGE UNSTAINED CELLS: 1 % (ref 0–4)
LYMPHOCYTES ABSOLUTE COUNT: 0.5 10*9/L — ABNORMAL LOW (ref 1.5–5.0)
MEAN CORPUSCULAR HEMOGLOBIN CONC: 31.8 g/dL (ref 31.0–37.0)
MEAN CORPUSCULAR HEMOGLOBIN: 29.2 pg (ref 26.0–34.0)
MEAN CORPUSCULAR VOLUME: 91.6 fL (ref 80.0–100.0)
MEAN PLATELET VOLUME: 11.4 fL — ABNORMAL HIGH (ref 7.0–10.0)
MONOCYTES ABSOLUTE COUNT: 0.4 10*9/L (ref 0.2–0.8)
NEUTROPHILS ABSOLUTE COUNT: 5.7 10*9/L (ref 2.0–7.5)
NEUTROPHILS RELATIVE PERCENT: 85.7 %
PLATELET COUNT: 96 10*9/L — ABNORMAL LOW (ref 150–440)
RED BLOOD CELL COUNT: 2.55 10*12/L — ABNORMAL LOW (ref 4.00–5.20)
RED CELL DISTRIBUTION WIDTH: 17.4 % — ABNORMAL HIGH (ref 12.0–15.0)
WBC ADJUSTED: 6.7 10*9/L (ref 4.5–11.0)

## 2017-08-01 LAB — BASIC METABOLIC PANEL
ANION GAP: 10 mmol/L (ref 9–15)
BLOOD UREA NITROGEN: 18 mg/dL (ref 7–21)
BUN / CREAT RATIO: 36
CALCIUM: 8.4 mg/dL — ABNORMAL LOW (ref 8.5–10.2)
CHLORIDE: 113 mmol/L — ABNORMAL HIGH (ref 98–107)
CO2: 23 mmol/L (ref 22.0–30.0)
CREATININE: 0.5 mg/dL — ABNORMAL LOW (ref 0.60–1.00)
EGFR MDRD AF AMER: >=60 mL/min/{1.73_m2} (ref >=60)
EGFR MDRD NON AF AMER: >=60 mL/min/{1.73_m2} (ref >=60)
GLUCOSE RANDOM: 131 mg/dL (ref 65–179)
POTASSIUM: 3 mmol/L — ABNORMAL LOW (ref 3.5–5.0)
SODIUM: 146 mmol/L — ABNORMAL HIGH (ref 135–145)

## 2017-08-01 LAB — CREATININE: Creatinine:MCnc:Pt:Ser/Plas:Qn:: 0.5 — ABNORMAL LOW

## 2017-08-01 LAB — VARIABLE HEMOGLOBIN CONCENTRATION

## 2017-08-01 NOTE — Unmapped (Signed)
Patient drowsy overnight, AMS improving, vitals remain stable. Patient continues to have trouble swallowing. Son insists on giving patient sips of water despite education on aspiration precautions. No other acute events overnight, wctm.       Problem: Adult Inpatient Plan of Care  Goal: Plan of Care Review  Outcome: Progressing  Goal: Patient-Specific Goal (Individualization)  Outcome: Progressing  Goal: Absence of Hospital-Acquired Illness or Injury  Outcome: Progressing  Goal: Optimal Comfort and Wellbeing  Outcome: Progressing  Goal: Readiness for Transition of Care  Outcome: Progressing  Goal: Rounds/Family Conference  Outcome: Progressing     Problem: Skin Injury Risk Increased  Goal: Skin Health and Integrity  Outcome: Progressing     Problem: Self-Care Deficit  Goal: Improved Ability to Complete Activities of Daily Living  Outcome: Progressing     Problem: Fall Injury Risk  Goal: Absence of Fall and Fall-Related Injury  Outcome: Progressing     Problem: Diabetes Comorbidity  Goal: Blood Glucose Level Within Desired Range  Outcome: Progressing     Problem: Hypertension Comorbidity  Goal: Blood Pressure in Desired Range  Outcome: Progressing     Problem: Pain Chronic (Persistent) (Comorbidity Management)  Goal: Acceptable Pain Control and Functional Ability  Outcome: Progressing

## 2017-08-01 NOTE — Unmapped (Signed)
PICC LINE TRIAGE NOTE    The Venous Access Team has received an order for PICC placement. This patient has been triaged and  VAT will attempt bedside placement as schedule permits .      Thank You,    Marylee Floras RN Venous Access Team [phone]      Workup Time:  30 minutes

## 2017-08-01 NOTE — Unmapped (Signed)
PICC LINE INSERTION PROCEDURE NOTE    Indications:  Antibiotic Therapy    Consent/Time Out:    Risks, benefits and alternatives discussed with patients son.  Written consent was obtained prior to the procedure and is detailed in the medical record.  Prior to the start of the procedure, a time out was taken and the identity of the patient was confirmed via name, medical record number and  date of birth.  The availability of the correct equipment was verified.    Procedure Details:  The vein was identified and measured for appropriate catheter length. Maximum sterile techniques was utilized.  Sterile field prepared with necessary supplies and equipment. Insertion site was prepped with chlorhexidine solution and allowed to dry. Lidocaine 2 mL subcutaneously and intradermally administered to insertion site.  The catheter was primed with normal saline.  A 4 FR Single lumen was inserted to the R Basilic vein with 1 insertion attempt(s).  Catheter aspirated, 3 mL blood return present.  The catheter was then flushed with 20 mL of normal saline.   Insertion site cleansed, and sterile dressing applied per manufacturer guidelines. The Central Line Checklist was referenced.  The catheter was inserted without difficulty by Adrian Prows RN and assisted by Iantha Fallen RN.      Findings:  Manufacturer:  [employer]  Lot #:  [id number]  CT Injectable (power):  Yes  Total catheter length:  39 cm.    Catheter left at:  0 cm.  Catheter trimmed:  yes  Port reserved:  No       Vein Size:   Right arm basilic vein compressible:  Yes, measurement:  3.7 mm      Tip Placement Verification:   [employer] and [employer]    Workup Time:    90 minutes    Recommendations:  PICC Brochure given to patient with teaching instruction.      See vein image below:

## 2017-08-01 NOTE — Unmapped (Signed)
E1 Daily Progress Note        Attending Attestation:  I saw and evaluated the patient, and participated in the key portions of the service. I reviewed and edited the resident's note and agree with the findings and plan.     [name], MD  Associate Professor  Heme/Onc    ===================================================      Interval history: NAEO. Patient underwent aspiration risk evaluation by speech pathology and was found to be at a mild risk for aspiration.    Assessment/Plan:    Principal Problem:    Bacteremia due to Streptococcus  Active Problems:    Primary CNS lymphoma (CMS-HCC)    Myelodysplastic disease (CMS-HCC)    Hypertension    Chronic left shoulder pain    Glaucoma (increased eye pressure)    Acute cystitis without hematuria    Somnolence  Resolved Problems:    * No resolved hospital problems. *      Carolyn Andersen is a 77 y.o. female that presented to [facility] with Bacteremia due to Streptococcus.    Streptococcus dysgalactiae bacteremia BCx from 5/3 growing strep dysgalactiae. Sensitivities pending. Initially treated in ED with vanc, cefepime, clindamycin. On arrival to 4-onc, patient's son expressed desire to pursue only comfort-based care with ultimate transition to hospice, so abx were not continued. Patient was febrile overnight while not being treated. Ethics consulted but still waiting to hear back. Upon further discussion on 5/5, patient's son is now electing to treat this infection (as well as her UTI described below).   - Continue CTX, plan for 14 days from date of BCx clearance (5/6-5/19)  - PICC placement    Aerococcus urinae UTI: UA on 5/3 with large leuk esterase and >120 WBCs. UCx growing aerococcus urinae.  Received vancomycin, cefepime, clindamycin in the ED. As above, abx were initially held when patient's son decided to pursue hospice. Now starting treatment on 5/5.   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 Relapsed spring 2018, now on Ibrutinib since 08/19/16 with relatively stable disease since initiation.  Last MRI April 2019 with stable disease with plan to continue ibrutinib 560 mg daily and repeat MRI in 3 months.   - Holding ibrutinib while inpatient    HTN: Had been holding home BP meds while bacteremic on presentation but blood pressure increasing now  - Resume home Losartan 100mg  daily  - Continue holding atenolol 25mg  daily    FEN/GI/PPX:  -- Diet: Regular  -- IVF: not indicated   -- DVT Ppx: lovenox  -- GI: not indicated  -- Lines: PIV  -- Code status: DNR/DNI    ___________________________________________________________________  Labs/Studies:  Labs and Studies from the last 24hrs per EMR and Reviewed    Objective:  Temp:  [35.7 ??C-37.4 ??C] 37.2 ??C  Heart Rate:  [87-99] 92  Resp:  [18-28] 18  BP: (138-178)/(64-77) 155/72  SpO2:  [91 %-98 %] 94 %    GEN: Elderly female lying in bed in no acute distress. A/Ox3  HEENT: EOMI  CV: No JVD. RRR  RESP: Normal WoB on nasal cannula  EXT: bilateral venous stasis dermatitis

## 2017-08-01 NOTE — Unmapped (Signed)
Speech Language Pathology Clinical Swallow Assessment  Evaluation (08/01/17 1040)    Patient Name:  Carolyn Andersen       Medical Record Number: [id number]   Date of Birth: 28-Sep-1940  Sex: Female            SLP Treatment Diagnosis: r/o dysphagia  Activity Tolerance: Patient tolerated treatment well    Assessment  Pt seen today for clinical swallowing evaluation. She currently presents with an oropharyngeal swallowing mechanism that appears grossly [facility]. Oral mech exam WNL. Pt with throat clearing behavior prior to initiation of PO trials. Aggressive dry coughing episode following x2/7 thin liquid trials and x1/4 jello trials. This was followed by clavicular breathing. No coughing following graham crackers. No oral residual. Pt reported mild pain on her tongue (It feels rough); observed appearance of scabs/dryness on lingual surface. No overt s/sx aspiration when SLP provided 1:1 assistance (pt appeared to have UE weakness/slowness), maintained upright position, and maintained external pacing to allow for adequate breaths between bites/sips. Voice remained mildly hoarse yet clear for duration of study.    Given improved performance with strategies and no concern for aspiration pneumonia on CXR, recommend regular diet and thin liquids following strict aspiration precautions: upright with intake, small/single bites and sips, slow rate, external pacing, assistance with meals. Discussed results directly with RN. No further acute ST services appear warranted at this time. Please reconsult should acute changes to swallow status occur during inpatient stay. MBSS may be warranted at that time.     Risk for Aspiration: Mild     Recommendations:         Diet Solids Recommendation: Regular Solids (no restrictions)    Diet Liquids Recommendations: No liquid consistency restrictions    Recommended Form of Medications: Whole;Crushed;With liquid;With puree(As pt prefers and as clinically indicated)      Compensatory Swallowing Strategies: Upright as possible for all oral intake;One to one assist with meals;Eat/feed slowly;Small bites/sips;External pacing;Alternate solids and liquids    Discharge Recommendations:  SLP services not indicated          Prognosis:       Plan of Care  SLP Follow-up / Frequency: D/C Services, D/C Services      Patient and Family Goals: Get something to eat    Subjective  Patient/Caregiver Reports: RN reported that pt's son observed pt coughing with liquids.   Communication Preference: Verbal    Other; Sulfasalazine; Decongestant (brompheniramine); Estrogens; Iodine and iodide containing products; Metoprolol; Pseudoephedrine hcl; Statins-hmg-coa reductase inhibitors; Sulfa (sulfonamide antibiotics); Crab; and Shellfish containing products  Current Facility-Administered Medications   Medication Dose Route Frequency Provider Last Rate Last Dose   ??? acetaminophen (TYLENOL) solution 650 mg  650 mg Oral Q4H PRN Georgeanna Harrison, MD       ??? albuterol 2.5 mg /3 mL (0.083 %) nebulizer solution 2.5 mg  2.5 mg Nebulization Q4H PRN Jacqulyn Bath, MD   2.5 mg at 07/31/17 1349   ??? aspirin chewable tablet 81 mg  81 mg Oral Daily Georgeanna Harrison, MD   81 mg at 07/31/17 1459   ??? brimonidine (ALPHAGAN) 0.2 % ophthalmic solution 1 drop  1 drop Both Eyes TID Leveda Anna, MD   1 drop at 07/31/17 2119   ??? cefTRIAXone (ROCEPHIN) 2 g in sodium chloride 0.9 % (NS) 100 mL IVPB-connector bag  2 g Intravenous Q24H Sandi Raveling, MD 200 mL/hr at 07/31/17 2111 2 g at 07/31/17 2111   ??? docusate sodium (COLACE) capsule 100  mg  100 mg Oral Nightly PRN Leveda Anna, MD       ??? enoxaparin (LOVENOX) syringe 40 mg  40 mg Subcutaneous Q24H Jacqulyn Bath, MD   40 mg at 07/31/17 1351   ??? fluticasone propionate (FLOVENT HFA) 220 mcg/actuation inhaler 2 puff  2 puff Inhalation BID (RT) Damien Fusi, MD       ??? ipratropium (ATROVENT) 0.02 % nebulizer solution 500 mcg  500 mcg Nebulization Q4H PRN Jacqulyn Bath, MD   500 mcg at 07/31/17 1349   ??? losartan (COZAAR) tablet 100 mg  100 mg Oral Daily Sandi Raveling, MD       ??? montelukast (SINGULAIR) tablet 10 mg  10 mg Oral Nightly Georgeanna Harrison, MD       ??? MORPhine 4 mg/mL injection 1 mg  1 mg Intravenous Q4H PRN Georgeanna Harrison, MD   1 mg at 07/31/17 0349   ??? ondansetron (ZOFRAN-ODT) disintegrating tablet 4 mg  4 mg Oral Q6H PRN Leveda Anna, MD       ??? polyethylene glycol (MIRALAX) packet 17 g  17 g Oral Daily PRN Jacqulyn Bath, MD       ??? polyethylene glycol (MIRALAX) packet 17 g  17 g Oral Daily Georgeanna Harrison, MD       ??? potassium chloride (KLOR-CON) packet 40 mEq  40 mEq Oral Once Sandi Raveling, MD       ??? saliva stimulant mucosal spray  1 spray Oral Q2H PRN Damien Fusi, MD       ??? senna [facility]) tablet 2 tablet  2 tablet Oral Nightly PRN Leveda Anna, MD       ??? senna [facility]) tablet 2 tablet  2 tablet Oral Nightly Georgeanna Harrison, MD       ??? sodium chloride (NS) 0.9 % flush 10 mL  10 mL Intravenous Q8H Gae Bon, MD       ??? timolol (TIMOPTIC) 0.25 % ophthalmic solution 1 drop  1 drop Both Eyes BID Leveda Anna, MD   1 drop at 07/31/17 2114     Past Medical History:   Diagnosis Date   ??? Asthma    ??? Diabetes mellitus (CMS-HCC)    ??? Granuloma annulare    ??? High cholesterol    ??? Hypertension    ??? Myelodysplastic disease (CMS-HCC)    ??? PVD (peripheral vascular disease) (CMS-HCC)    ??? Seasonal allergies      Family History   Problem Relation Age of Onset   ??? Coronary artery disease Father    ??? Cancer Mother      Past Surgical History:   Procedure Laterality Date   ??? DILATION AND CURETTAGE OF UTERUS     ??? EYE SURGERY     ??? SHOULDER ARTHROSCOPY     ??? TUBAL LIGATION       Social History     Tobacco Use   ??? Smoking status: Former Smoker     Packs/day: 2.00     Years: 45.00     Pack years: 90.00     Types: Cigarettes     Last attempt to quit: 12/23/2014     Years since quitting: 2.6   ??? Smokeless tobacco: Never Used   Substance Use Topics   ??? Alcohol use: No     Alcohol/week: 0.0 oz       General:  Current Functional Status: Carolyn Andersen is a 77  y.o. 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 Currently NPO. Per RN, son reported coughing with liquid intake.        Pain Comments : Oral pain    Precautions: Aspiration precautions  Required Braces or Orthoses: Non- applicable  Medical Tests / Procedures Comments: CXR 5/8: Stable mild cardiomegaly. Mild pulmonary edema noted. No focal consolidation. No pleural effusion is seen.        Vision: Functional for self-feeding    Objective  Temperature Spikes Noted: No  Respiratory Status : Room air  History of Intubation: No          Behavior/Cognition: Cooperative;Pleasant mood;Alert  Positioning : Upright in chair    Oral / Motor Exam    Vocal Quality: Dysphonic(Mild hoarseness)  Volitional Swallow: Within Functional Limits   Labial ROM: Within Functional Limits   Labial Symmetry: Within Functional Limits  Labial Strength: Within Functional Limits   Lingual ROM: Within Functional Limits  Lingual Symmetry: Within Functional Limits  Lingual Strength: Within Functional Limits      Velum: Within Functional Limits   Mandible: Within Functional Limits     Facial ROM: Within Functional Limits   Facial Symmetry: Within Functional Limits  Facial Strength: Within Functional Limits      Vocal Intensity: Within Functional Limits       Apraxia: None present   Dysarthria: None present   Intelligibility: Intelligible   Breath Support: Adequate for speech   Dentition: Adequate    Consistencies assessed: thin liquids, jello, graham crackers       Session Duration : 20      I attest that I have reviewed the above information.  Signed: Eliezer Mccoy, SLP    Filed 08/01/2017

## 2017-08-02 LAB — CBC W/ AUTO DIFF
BASOPHILS ABSOLUTE COUNT: 0 10*9/L (ref 0.0–0.1)
BASOPHILS RELATIVE PERCENT: 0.4 %
EOSINOPHILS ABSOLUTE COUNT: 0.1 10*9/L (ref 0.0–0.4)
EOSINOPHILS RELATIVE PERCENT: 1.1 %
HEMATOCRIT: 22.6 % — ABNORMAL LOW (ref 36.0–46.0)
LYMPHOCYTES ABSOLUTE COUNT: 0.5 10*9/L — ABNORMAL LOW (ref 1.5–5.0)
LYMPHOCYTES RELATIVE PERCENT: 7.9 %
MEAN CORPUSCULAR HEMOGLOBIN CONC: 31.7 g/dL (ref 31.0–37.0)
MEAN CORPUSCULAR HEMOGLOBIN: 29.4 pg (ref 26.0–34.0)
MEAN CORPUSCULAR VOLUME: 92.7 fL (ref 80.0–100.0)
MEAN PLATELET VOLUME: 11 fL — ABNORMAL HIGH (ref 7.0–10.0)
MONOCYTES ABSOLUTE COUNT: 0.5 10*9/L (ref 0.2–0.8)
MONOCYTES RELATIVE PERCENT: 7.3 %
NEUTROPHILS ABSOLUTE COUNT: 5.4 10*9/L (ref 2.0–7.5)
NEUTROPHILS RELATIVE PERCENT: 82 %
PLATELET COUNT: 120 10*9/L — ABNORMAL LOW (ref 150–440)
RED CELL DISTRIBUTION WIDTH: 18.1 % — ABNORMAL HIGH (ref 12.0–15.0)
WBC ADJUSTED: 6.6 10*9/L (ref 4.5–11.0)

## 2017-08-02 LAB — BASIC METABOLIC PANEL
ANION GAP: 8 mmol/L — ABNORMAL LOW (ref 9–15)
BLOOD UREA NITROGEN: 18 mg/dL (ref 7–21)
BUN / CREAT RATIO: 44
CALCIUM: 8.5 mg/dL (ref 8.5–10.2)
CHLORIDE: 110 mmol/L — ABNORMAL HIGH (ref 98–107)
CO2: 24 mmol/L (ref 22.0–30.0)
CREATININE: 0.41 mg/dL — ABNORMAL LOW (ref 0.60–1.00)
EGFR MDRD AF AMER: >=60 mL/min/{1.73_m2} (ref >=60)
GLUCOSE RANDOM: 116 mg/dL (ref 65–179)
POTASSIUM: 3.1 mmol/L — ABNORMAL LOW (ref 3.5–5.0)
SODIUM: 142 mmol/L (ref 135–145)

## 2017-08-02 LAB — MAGNESIUM: Magnesium:MCnc:Pt:Ser/Plas:Qn:: 2.5 — ABNORMAL HIGH

## 2017-08-02 LAB — CREATININE: Creatinine:MCnc:Pt:Ser/Plas:Qn:: 0.41 — ABNORMAL LOW

## 2017-08-02 LAB — MEAN CORPUSCULAR HEMOGLOBIN: Lab: 29.4

## 2017-08-02 NOTE — Unmapped (Signed)
Pt w/increasing ability to interact with staff this shift; had speech eval that recommends pt be upright @ 90 degrees for all oral intake and that for meals, she be given time to take several breaths after each swallow of food or liquid and to pace meals with time for rest between bites; pt able to follow these instructions with staff assistance; VSS, received SL PICC on RUA today and functioning well; foley conts patent, family at bedside and education, support and encouragement provided throughout shift; cont to monitor

## 2017-08-02 NOTE — Unmapped (Signed)
E1 Daily Progress Note      Attending Attestation:  I saw and evaluated the patient, and participated in the key portions of the service. I reviewed and edited the resident's note and agree with the findings and plan.     [name], MD  Associate Professor  Heme/Onc    ===================================================        Interval history: NAEO. Tolerated PICC placement well yesterday.  Patient endorses feeling much better than on presentation.  Patient denies bleeding as far as she's aware, including nose bleeds, BRBPR, melena.  No new aches/pains.    Assessment/Plan:    Principal Problem:    Bacteremia due to Streptococcus  Active Problems:    Primary CNS lymphoma (CMS-HCC)    Myelodysplastic disease (CMS-HCC)    Hypertension    Chronic left shoulder pain    Glaucoma (increased eye pressure)    Acute cystitis without hematuria    Somnolence  Resolved Problems:    * No resolved hospital problems. *      Carolyn Andersen is a 77 y.o. female that presented to [facility] with Bacteremia due to Streptococcus.    Streptococcus dysgalactiae bacteremia BCx from 5/3 growing strep dysgalactiae. Sensitivities pending. Initially treated in ED with vanc, cefepime, clindamycin. On arrival to 4-onc, patient's son expressed desire to pursue only comfort-based care with ultimate transition to hospice, so abx were not continued. Patient was febrile overnight while not being treated. Ethics consulted but still waiting to hear back. Upon further discussion on 5/5, patient's son is now electing to treat this infection (as well as her UTI described below).   - Continue CTX, plan for 14 days from date of BCx clearance (5/6-5/19)  - S/p PICC placement on 5/8    Aerococcus urinae UTI: UA on 5/3 with large leuk esterase and >120 WBCs. UCx growing aerococcus urinae.  Received vancomycin, cefepime, clindamycin in the ED. As above, abx were initially held when patient's son decided to pursue hospice. Now starting treatment on 5/5.   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 Relapsed spring 2018, now on Ibrutinib since 08/19/16 with relatively stable disease since initiation.  Last MRI April 2019 with stable disease with plan to continue ibrutinib 560 mg daily and repeat MRI in 3 months.   - Resume home ibrutinib now that stable from an infectious standpoint.  We will need to get her home supply of ibrutinib.    HTN: Had been holding home BP meds while bacteremic on presentation, but now robustly hypertensive and stable --> resuming home antihypertensives.  - continue home Losartan 100mg  daily  - Resume home atenolol 25mg  daily    Anemia: Patient with Hgb 7.2, has a history of fluctuating ~8-10 most likely 2/2 ACD. Without s/s of bleeding, benign abdomen on physical exam. HDS. Possible etiology of downtrend today is blood loss from PICC line insertion on 5/8. Will CTM.  - Daily CBC  - Stool guaiac    Dispo: Looking for SNF placement    FEN/GI/PPX:  -- Diet: Regular  -- IVF: not indicated   -- DVT PPx: lovenox  -- GI: not indicated  -- Lines: PIV  -- Code status: DNR/DNI    ___________________________________________________________________  Labs/Studies:  Labs and Studies from the last 24hrs per EMR and Reviewed    Objective:  Temp:  [35.7 ??C-37.2 ??C] 35.9 ??C  Heart Rate:  [72-92] 72  Resp:  [16-18] 18  BP: (143-167)/(64-74) 146/64  SpO2:  [94 %-99 %]  99 %    GEN: Elderly female lying in bed in no acute distress. A/Ox3  HEENT: EOMI  CV: No JVD. RRR  RESP: Normal WoB on nasal cannula  ABD: Soft, NT/ND, +BS  EXT: bilateral venous stasis dermatitis

## 2017-08-02 NOTE — Unmapped (Signed)
Patient alert and oriented, VSS. Patient taking medications and sips of water overnight with no difficulty. AMS continues to improve. No acute events overnight, wctm.       Problem: Adult Inpatient Plan of Care  Goal: Plan of Care Review  Outcome: Progressing  Goal: Patient-Specific Goal (Individualization)  Outcome: Progressing  Goal: Absence of Hospital-Acquired Illness or Injury  Outcome: Progressing  Goal: Optimal Comfort and Wellbeing  Outcome: Progressing  Goal: Readiness for Transition of Care  Outcome: Progressing  Goal: Rounds/Family Conference  Outcome: Progressing     Problem: Skin Injury Risk Increased  Goal: Skin Health and Integrity  Outcome: Progressing     Problem: Self-Care Deficit  Goal: Improved Ability to Complete Activities of Daily Living  Outcome: Progressing     Problem: Fall Injury Risk  Goal: Absence of Fall and Fall-Related Injury  Outcome: Progressing     Problem: Pain Chronic (Persistent) (Comorbidity Management)  Goal: Acceptable Pain Control and Functional Ability  Outcome: Progressing

## 2017-08-02 NOTE — Unmapped (Signed)
Pt remained stable during shift, afebrile, with VSS.  No complaints of pain or nausea during shift.  Pt continued on Q2 turns per protocol.  Tolerated well.  Will continue to monitor.

## 2017-08-03 LAB — CBC W/ AUTO DIFF
BASOPHILS ABSOLUTE COUNT: 0 10*9/L (ref 0.0–0.1)
BASOPHILS RELATIVE PERCENT: 0.3 %
EOSINOPHILS ABSOLUTE COUNT: 0.1 10*9/L (ref 0.0–0.4)
EOSINOPHILS RELATIVE PERCENT: 1.4 %
HEMATOCRIT: 22.5 % — ABNORMAL LOW (ref 36.0–46.0)
HEMOGLOBIN: 7.2 g/dL — ABNORMAL LOW (ref 12.0–16.0)
LARGE UNSTAINED CELLS: 1 % (ref 0–4)
LYMPHOCYTES RELATIVE PERCENT: 9.1 %
MEAN CORPUSCULAR HEMOGLOBIN CONC: 32 g/dL (ref 31.0–37.0)
MEAN CORPUSCULAR HEMOGLOBIN: 29.2 pg (ref 26.0–34.0)
MEAN CORPUSCULAR VOLUME: 91.5 fL (ref 80.0–100.0)
MEAN PLATELET VOLUME: 10.9 fL — ABNORMAL HIGH (ref 7.0–10.0)
MONOCYTES ABSOLUTE COUNT: 0.3 10*9/L (ref 0.2–0.8)
MONOCYTES RELATIVE PERCENT: 4.6 %
NEUTROPHILS ABSOLUTE COUNT: 6 10*9/L (ref 2.0–7.5)
PLATELET COUNT: 142 10*9/L — ABNORMAL LOW (ref 150–440)
RED CELL DISTRIBUTION WIDTH: 17.6 % — ABNORMAL HIGH (ref 12.0–15.0)
WBC ADJUSTED: 7.2 10*9/L (ref 4.5–11.0)

## 2017-08-03 LAB — MEAN PLATELET VOLUME: Lab: 10.9 — ABNORMAL HIGH

## 2017-08-03 LAB — BASIC METABOLIC PANEL
ANION GAP: 9 mmol/L (ref 9–15)
BLOOD UREA NITROGEN: 17 mg/dL (ref 7–21)
BUN / CREAT RATIO: 43
CALCIUM: 7.9 mg/dL — ABNORMAL LOW (ref 8.5–10.2)
CHLORIDE: 108 mmol/L — ABNORMAL HIGH (ref 98–107)
CO2: 25 mmol/L (ref 22.0–30.0)
CREATININE: 0.4 mg/dL — ABNORMAL LOW (ref 0.60–1.00)
EGFR MDRD AF AMER: >=60 mL/min/{1.73_m2} (ref >=60)
EGFR MDRD NON AF AMER: >=60 mL/min/{1.73_m2} (ref >=60)
GLUCOSE RANDOM: 123 mg/dL (ref 65–179)

## 2017-08-03 LAB — GLUCOSE RANDOM: Glucose:MCnc:Pt:Ser/Plas:Qn:: 123

## 2017-08-03 MED ORDER — CEFTRIAXONE IVPB 2 GRAM CONNECTOR BAG
INTRAVENOUS | 0 refills | 0.00000 days
Start: 2017-08-03 — End: 2017-08-13

## 2017-08-03 NOTE — Unmapped (Signed)
Pt alert x oriented, forgetful at times. Pt afebrile, VSS. Pt received dose of Tylenol for generalized pain with good results. Pt continues on Q2 turns, foley intact and draining with no issues. Pt has been free from falls and injuries. Bed alarm activated for safety, pt's son at bedside. Will continue to monitor pt status.

## 2017-08-03 NOTE — Unmapped (Signed)
Problem: Adult Inpatient Plan of Care  Goal: Plan of Care Review  08/03/2017 1328 by Valinda Party, RN  Outcome: Transitioned to Another Facility  08/03/2017 1324 by Valinda Party, RN  Outcome: Progressing  Goal: Patient-Specific Goal (Individualization)  08/03/2017 1328 by Valinda Party, RN  Outcome: Transitioned to Another Facility  08/03/2017 1324 by Valinda Party, RN  Outcome: Progressing  Goal: Absence of Hospital-Acquired Illness or Injury  08/03/2017 1328 by Valinda Party, RN  Outcome: Transitioned to Another Facility  08/03/2017 1324 by Valinda Party, RN  Outcome: Progressing  Goal: Optimal Comfort and Wellbeing  08/03/2017 1328 by Valinda Party, RN  Outcome: Transitioned to Another Facility  08/03/2017 1324 by Valinda Party, RN  Outcome: Progressing  Goal: Readiness for Transition of Care  08/03/2017 1328 by Valinda Party, RN  Outcome: Transitioned to Another Facility  08/03/2017 1324 by Valinda Party, RN  Outcome: Progressing  Goal: Rounds/Family Conference  08/03/2017 1328 by Valinda Party, RN  Outcome: Transitioned to Another Facility  08/03/2017 1324 by Valinda Party, RN  Outcome: Progressing     Problem: Skin Injury Risk Increased  Goal: Skin Health and Integrity  08/03/2017 1328 by Valinda Party, RN  Outcome: Transitioned to Another Facility  08/03/2017 1324 by Valinda Party, RN  Outcome: Progressing     Problem: Self-Care Deficit  Goal: Improved Ability to Complete Activities of Daily Living  08/03/2017 1328 by Valinda Party, RN  Outcome: Transitioned to Another Facility  08/03/2017 1324 by Valinda Party, RN  Outcome: Progressing     Problem: Fall Injury Risk  Goal: Absence of Fall and Fall-Related Injury  08/03/2017 1328 by Valinda Party, RN  Outcome: Transitioned to Another Facility  08/03/2017 1324 by Valinda Party, RN  Outcome: Progressing     Problem: Pain Chronic (Persistent) (Comorbidity Management)  Goal: Acceptable Pain Control and Functional Ability  08/03/2017 1328 by Valinda Party, RN  Outcome: Transitioned to Another Facility  08/03/2017 1324 by Valinda Party, RN  Outcome: Progressing

## 2017-08-03 NOTE — Unmapped (Signed)
Patient awaiting transfer to [facility] facility. Patient AAOx4, at times forgetful. Foley catheter removed, awaiting trial of void with Purewick in place. Last BM 08/03/2017. All questions answered. Will continue to monitor until patient discharged.

## 2017-08-03 NOTE — Unmapped (Signed)
Physician Discharge Summary    Admit date: 07/27/2017    Discharge date and time: 08/03/2017   15:00    Discharge to: SNF    Discharge Service: Oncology/Hematology (MDE)    Discharge Attending Physician: Carolyn Bon, MD    Discharge Diagnoses: AMS, Bacteremia (streptococcus dysgalactiae),   UTI (aerococcus urinae)    Pertinent Test Results:   Blood Culture #2 [[id number]] (Abnormal) Collected: 07/27/17 2134   Order Status: Completed Specimen: Blood from 1 Peripheral Draw Updated: 07/30/17 1534    Blood Culture, Routine Streptococcus dysgalactiae ss equisimilis (grp c/g)Abnormal     Gram Stain Result Gram positive cocci in chains   Blood Culture #1 [[id number]] (Abnormal) Collected: 07/27/17 2144   Order Status: Completed Specimen: Blood from 1 Peripheral Draw Updated: 07/30/17 0814    Blood Culture, Routine Streptococcus dysgalactiae ss equisimilis (grp c/g)Abnormal     Gram Stain Result Gram positive cocci in chains   Urine Culture [[id number]] (Abnormal) Collected: 07/27/17 2134   Order Status: Completed Specimen: Urine Updated: 07/29/17 1038    Urine Culture, Comprehensive >100,000 CFU/mL Aerococcus urinaeAbnormal          Hospital Course:  Carolyn Andersen is a 77 y.o. 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    Streptococcus dysgalactiae bacteremia: Patient initially presented with somnolence and AMS, which improved to cognitive baseline by time of discharge with antibiotic treatment. Blood cultures from 5/3 grew 2/2 Streptococcus dysgalactiae ss equisimilis (grp c/g).  Hemodynamically stable.  -- Ceftriaxone IV 2g q24h at 239mL/hr, end date 5/20 for 14-day course  -- Check CBC with platelets and differential, BUN, SCr on 5/16    Aerococcus urinae UTI: Treatment as above.    AMS - resolved: Due to above-listed infections. Treatment as above.  ??  PCNSL:??S/p WBRT on 12/2014. ??Incurable, relapsed now on??Ibrutinib since 08/19/16 with relatively stable disease since initiation. ??Last MRI April 2019 with stable disease with plan to continue ibrutinib 560 mg daily and repeat MRI in 3 months. Follow[facility] with [facility]. Aviva Andersen [facility]  -- Continue home ibrutinib on discharge, patient has home supply she will bring with her to the SNF she is discharging to  -- HCPOA is Carolyn Andersen, patient's son. Cell: [phone]    Anemia: Patient has a history of fluctuating Hgb ~8-10 most likely 2/2 ACD. Without s/s of bleeding, benign abdomen on physical exam. HDS. Hemoglobin down to 7.2 s/p PICC line insertion on 5/8, stable since.  ??  HTN: Continue home regimen unchanged.     Condition at Discharge: stable  Discharge Medications:      Your Medication List      START taking these medications    cefTRIAXone 2 g in sodium chloride 0.9 % 0.9 % 100 mL IVPB  Infuse 2 g into a venous catheter daily. for 10 days        CONTINUE taking these medications    acetaminophen 500 MG tablet  Commonly known as:  TYLENOL  Take 500 mg by mouth Two (2) times a day.     atenolol 25 MG tablet  Commonly known as:  TENORMIN  Take 25 mg by mouth daily.     atorvastatin 40 MG tablet  Commonly known as:  LIPITOR  Take 1 tablet (40 mg total) by mouth daily.     cetirizine 10 MG tablet  Commonly known as:  ZyrTEC  Take 10 mg by mouth daily.     cilostazol 100 MG tablet  Commonly known as:  PLETAL  Take 100 mg by mouth Two (2) times a day.     docusate sodium 100 MG capsule  Commonly known as:  COLACE  Take 100 mg by mouth nightly as needed.     fluticasone propionate 220 mcg/actuation inhaler  Commonly known as:  FLOVENT HFA  Inhale 2 puffs daily as needed.     ibrutinib 560 mg tablet  Commonly known as:  IMBRUVICA  Take 1 tablet (560 mg total) by mouth daily.     ICAPS MV ORAL  Take 1 capsule by mouth daily.     losartan 100 MG tablet  Commonly known as:  COZAAR  Take 100 mg by mouth.     montelukast 10 mg tablet  Commonly known as:  SINGULAIR  Take 10 mg by mouth nightly.     SENOKOT 8.6 mg tablet  Generic drug:  senna  Take 2 tablets by mouth.     ZYLET 0.3-0.5 % Drps  Generic drug:  tobramycin-lotepred  Apply to eye.            Pending Test Results:      Order Current Status    Blood Culture, Adult Preliminary result    Blood Culture, Adult Preliminary result          Discharge Instructions:       Follow Up instructions and Outpatient Referrals     Call MD for:  severe uncontrolled pain      Call MD for: Temperature > 38.5 Celsius ( > 101.3 Fahrenheit)      Discharge instructions      [name], you were admitted to [facility] for a bacterial infection of your bloodstream and urine. We treated you with antibiotics and your condition has improved significantly. You will complete your course of IV antibiotics at the rehabilitation facility you are going to to rebuild functional strength.    It was a privilege caring for you, and we wish you all the best!         Discharge instructions      I certify that based on my evaluation of this patient, this patient requires BLS transportation services and that other forms of transport are contraindicated.  Please refer to care management transitions note for transportation details.             Appointments which have been scheduled for you    Aug 17, 2017  8:30 AM EDT  (Arrive by 8:00 AM)  LAB ONLY [place] with ADULT ONC LAB  [facility] ADULT ONCOLOGY LAB DRAW STATION [facility] REGION) [address]  [place] [address]  [phone]      Aug 17, 2017  9:30 AM EDT  (Arrive by 9:00 AM)  RETURN ACTIVE [place] with Nita Sells, MD  [facility] HEMATOLOGY ONCOLOGY 2ND FLR CANCER HOSP [facility] [place] [facility]) [address]  [place] [address]  [phone]      Sep 28, 2017 12:15 PM EDT  (Arrive by 11:45 AM)  MRI BRAIN W WO CONTRAST    -UN with [facility] MRI RM 1  IMG MRI [facility]) [address]  [place] [address] [phone]   On appt date:  Bring recent lab work  Bring documentation of any metal object implants  Take meds as usual  Check w/physician if diabetic  You will be  asked to change into a gown for your safety    On appt date do not:  Consume anything 2 hrs  Wear metallic items including jewelry (we are not responsible for lost items)    Let us know if pt:  Claustrophobic  Metal object implant  Pregnant  Prescribed a sedative  On dialysis  Allergic to MRI dye/contrast  Kidney Failure    (Title:MRIWCNTRST)     Sep 28, 2017  2:00 PM EDT  (Arrive by 1:30 PM)  RETURN FOLLOW UP [place] with Nita Sells, MD  [facility] HEMATOLOGY ONCOLOGY 2ND FLR CANCER HOSP [facility] REGION) [address]  [place] [address]  [phone]              I spent greater than 30 minutes in the discharge of this patient.    Sandi Raveling, MD

## 2017-08-10 NOTE — Unmapped (Signed)
Spoke to patient's son.  He reported pt is now in hospice care and no longer needs the Imbruvica.  Disenrolled patient.

## 2019-06-26 DEATH — deceased
